# Patient Record
Sex: Female | Born: 1945 | Race: White | Hispanic: No | Marital: Married
Health system: Southern US, Community
[De-identification: ages and names within clinical notes are randomized; demographics above are authoritative.]

## PROBLEM LIST (undated history)

## (undated) DIAGNOSIS — I10 Essential (primary) hypertension: Secondary | ICD-10-CM

## (undated) DIAGNOSIS — I2510 Atherosclerotic heart disease of native coronary artery without angina pectoris: Secondary | ICD-10-CM

## (undated) DIAGNOSIS — E119 Type 2 diabetes mellitus without complications: Secondary | ICD-10-CM

## (undated) DIAGNOSIS — E7800 Pure hypercholesterolemia, unspecified: Secondary | ICD-10-CM

## (undated) HISTORY — PX: CARDIAC SURGERY: SHX584

## (undated) HISTORY — PX: ABDOMINAL HYSTERECTOMY: SHX81

## (undated) HISTORY — PX: CHOLECYSTECTOMY: SHX55

## (undated) HISTORY — PX: FRACTURE SURGERY: SHX138

## (undated) HISTORY — PX: OTHER SURGICAL HISTORY: SHX169

## (undated) HISTORY — PX: CORONARY ARTERY BYPASS GRAFT: SHX141

---

## 1998-02-26 ENCOUNTER — Emergency Department (HOSPITAL_COMMUNITY): Admission: EM | Admit: 1998-02-26 | Discharge: 1998-02-26 | Payer: Self-pay | Admitting: Emergency Medicine

## 1998-04-05 ENCOUNTER — Emergency Department (HOSPITAL_COMMUNITY): Admission: EM | Admit: 1998-04-05 | Discharge: 1998-04-05 | Payer: Self-pay

## 1998-04-06 ENCOUNTER — Emergency Department (HOSPITAL_COMMUNITY): Admission: EM | Admit: 1998-04-06 | Discharge: 1998-04-06 | Payer: Self-pay | Admitting: Emergency Medicine

## 1998-04-13 ENCOUNTER — Emergency Department (HOSPITAL_COMMUNITY): Admission: EM | Admit: 1998-04-13 | Discharge: 1998-04-13 | Payer: Self-pay

## 1998-04-17 ENCOUNTER — Emergency Department (HOSPITAL_COMMUNITY): Admission: EM | Admit: 1998-04-17 | Discharge: 1998-04-17 | Payer: Self-pay | Admitting: Emergency Medicine

## 1998-04-19 ENCOUNTER — Emergency Department (HOSPITAL_COMMUNITY): Admission: EM | Admit: 1998-04-19 | Discharge: 1998-04-19 | Payer: Self-pay | Admitting: Emergency Medicine

## 1998-04-22 ENCOUNTER — Other Ambulatory Visit: Admission: RE | Admit: 1998-04-22 | Discharge: 1998-04-22 | Payer: Self-pay | Admitting: Internal Medicine

## 1998-06-19 ENCOUNTER — Observation Stay (HOSPITAL_COMMUNITY): Admission: RE | Admit: 1998-06-19 | Discharge: 1998-06-20 | Payer: Self-pay | Admitting: Specialist

## 1998-06-19 ENCOUNTER — Encounter: Payer: Self-pay | Admitting: Specialist

## 1998-06-25 ENCOUNTER — Emergency Department (HOSPITAL_COMMUNITY): Admission: EM | Admit: 1998-06-25 | Discharge: 1998-06-25 | Payer: Self-pay

## 1998-06-27 ENCOUNTER — Encounter: Payer: Self-pay | Admitting: Emergency Medicine

## 1998-06-27 ENCOUNTER — Emergency Department (HOSPITAL_COMMUNITY): Admission: EM | Admit: 1998-06-27 | Discharge: 1998-06-27 | Payer: Self-pay | Admitting: Emergency Medicine

## 1998-08-23 ENCOUNTER — Emergency Department (HOSPITAL_COMMUNITY): Admission: EM | Admit: 1998-08-23 | Discharge: 1998-08-23 | Payer: Self-pay | Admitting: *Deleted

## 1998-09-22 ENCOUNTER — Encounter: Payer: Self-pay | Admitting: Gastroenterology

## 1998-09-22 ENCOUNTER — Ambulatory Visit (HOSPITAL_COMMUNITY): Admission: RE | Admit: 1998-09-22 | Discharge: 1998-09-22 | Payer: Self-pay | Admitting: Gastroenterology

## 1998-10-06 ENCOUNTER — Ambulatory Visit (HOSPITAL_COMMUNITY): Admission: RE | Admit: 1998-10-06 | Discharge: 1998-10-06 | Payer: Self-pay | Admitting: Specialist

## 1998-10-06 ENCOUNTER — Encounter: Payer: Self-pay | Admitting: Specialist

## 1998-11-09 ENCOUNTER — Emergency Department (HOSPITAL_COMMUNITY): Admission: EM | Admit: 1998-11-09 | Discharge: 1998-11-09 | Payer: Self-pay | Admitting: Emergency Medicine

## 1998-11-10 ENCOUNTER — Encounter: Payer: Self-pay | Admitting: Emergency Medicine

## 1998-11-15 ENCOUNTER — Encounter: Payer: Self-pay | Admitting: Emergency Medicine

## 1998-11-15 ENCOUNTER — Inpatient Hospital Stay (HOSPITAL_COMMUNITY): Admission: EM | Admit: 1998-11-15 | Discharge: 1998-11-17 | Payer: Self-pay | Admitting: *Deleted

## 1998-11-16 ENCOUNTER — Encounter: Payer: Self-pay | Admitting: Internal Medicine

## 1999-01-15 ENCOUNTER — Emergency Department (HOSPITAL_COMMUNITY): Admission: EM | Admit: 1999-01-15 | Discharge: 1999-01-15 | Payer: Self-pay | Admitting: Emergency Medicine

## 1999-04-14 ENCOUNTER — Encounter: Payer: Self-pay | Admitting: Emergency Medicine

## 1999-04-14 ENCOUNTER — Emergency Department (HOSPITAL_COMMUNITY): Admission: EM | Admit: 1999-04-14 | Discharge: 1999-04-14 | Payer: Self-pay | Admitting: Emergency Medicine

## 1999-06-04 ENCOUNTER — Encounter: Payer: Self-pay | Admitting: Gastroenterology

## 1999-06-04 ENCOUNTER — Ambulatory Visit (HOSPITAL_COMMUNITY): Admission: RE | Admit: 1999-06-04 | Discharge: 1999-06-04 | Payer: Self-pay | Admitting: Gastroenterology

## 1999-07-30 ENCOUNTER — Encounter: Payer: Self-pay | Admitting: Gastroenterology

## 1999-07-30 ENCOUNTER — Ambulatory Visit (HOSPITAL_COMMUNITY): Admission: RE | Admit: 1999-07-30 | Discharge: 1999-07-30 | Payer: Self-pay | Admitting: Gastroenterology

## 1999-08-14 ENCOUNTER — Encounter: Payer: Self-pay | Admitting: Emergency Medicine

## 1999-08-14 ENCOUNTER — Emergency Department (HOSPITAL_COMMUNITY): Admission: EM | Admit: 1999-08-14 | Discharge: 1999-08-14 | Payer: Self-pay | Admitting: Emergency Medicine

## 1999-12-31 ENCOUNTER — Emergency Department (HOSPITAL_COMMUNITY): Admission: EM | Admit: 1999-12-31 | Discharge: 1999-12-31 | Payer: Self-pay | Admitting: Emergency Medicine

## 2000-04-24 ENCOUNTER — Encounter: Payer: Self-pay | Admitting: Emergency Medicine

## 2000-04-24 ENCOUNTER — Emergency Department (HOSPITAL_COMMUNITY): Admission: EM | Admit: 2000-04-24 | Discharge: 2000-04-24 | Payer: Self-pay | Admitting: Emergency Medicine

## 2000-06-24 ENCOUNTER — Ambulatory Visit (HOSPITAL_COMMUNITY): Admission: RE | Admit: 2000-06-24 | Discharge: 2000-06-24 | Payer: Self-pay | Admitting: Family Medicine

## 2000-06-24 ENCOUNTER — Encounter: Payer: Self-pay | Admitting: Family Medicine

## 2000-07-07 ENCOUNTER — Ambulatory Visit (HOSPITAL_COMMUNITY): Admission: RE | Admit: 2000-07-07 | Discharge: 2000-07-07 | Payer: Self-pay | Admitting: Gastroenterology

## 2000-07-18 ENCOUNTER — Ambulatory Visit (HOSPITAL_COMMUNITY): Admission: RE | Admit: 2000-07-18 | Discharge: 2000-07-18 | Payer: Self-pay | Admitting: Gastroenterology

## 2000-09-04 ENCOUNTER — Ambulatory Visit (HOSPITAL_COMMUNITY): Admission: RE | Admit: 2000-09-04 | Discharge: 2000-09-04 | Payer: Self-pay | Admitting: Gastroenterology

## 2000-09-04 ENCOUNTER — Encounter: Payer: Self-pay | Admitting: Gastroenterology

## 2000-09-23 ENCOUNTER — Inpatient Hospital Stay (HOSPITAL_COMMUNITY): Admission: EM | Admit: 2000-09-23 | Discharge: 2000-09-30 | Payer: Self-pay | Admitting: *Deleted

## 2000-11-03 ENCOUNTER — Encounter (INDEPENDENT_AMBULATORY_CARE_PROVIDER_SITE_OTHER): Payer: Self-pay | Admitting: Specialist

## 2000-11-03 ENCOUNTER — Inpatient Hospital Stay (HOSPITAL_COMMUNITY): Admission: AD | Admit: 2000-11-03 | Discharge: 2000-11-13 | Payer: Self-pay | Admitting: Internal Medicine

## 2000-11-06 ENCOUNTER — Encounter: Payer: Self-pay | Admitting: Internal Medicine

## 2000-11-07 ENCOUNTER — Encounter: Payer: Self-pay | Admitting: Thoracic Surgery (Cardiothoracic Vascular Surgery)

## 2000-11-08 ENCOUNTER — Encounter: Payer: Self-pay | Admitting: Thoracic Surgery (Cardiothoracic Vascular Surgery)

## 2000-11-09 ENCOUNTER — Encounter: Payer: Self-pay | Admitting: Thoracic Surgery (Cardiothoracic Vascular Surgery)

## 2000-12-06 ENCOUNTER — Encounter
Admission: RE | Admit: 2000-12-06 | Discharge: 2000-12-06 | Payer: Self-pay | Admitting: Thoracic Surgery (Cardiothoracic Vascular Surgery)

## 2000-12-06 ENCOUNTER — Encounter: Payer: Self-pay | Admitting: Thoracic Surgery (Cardiothoracic Vascular Surgery)

## 2001-03-04 ENCOUNTER — Encounter: Payer: Self-pay | Admitting: Emergency Medicine

## 2001-03-04 ENCOUNTER — Emergency Department (HOSPITAL_COMMUNITY): Admission: EM | Admit: 2001-03-04 | Discharge: 2001-03-04 | Payer: Self-pay | Admitting: Emergency Medicine

## 2002-11-03 ENCOUNTER — Encounter: Payer: Self-pay | Admitting: Emergency Medicine

## 2002-11-03 ENCOUNTER — Inpatient Hospital Stay (HOSPITAL_COMMUNITY): Admission: EM | Admit: 2002-11-03 | Discharge: 2002-11-06 | Payer: Self-pay | Admitting: Emergency Medicine

## 2002-11-04 ENCOUNTER — Encounter: Payer: Self-pay | Admitting: Cardiovascular Disease

## 2003-01-24 ENCOUNTER — Emergency Department (HOSPITAL_COMMUNITY): Admission: EM | Admit: 2003-01-24 | Discharge: 2003-01-24 | Payer: Self-pay | Admitting: Emergency Medicine

## 2003-02-20 ENCOUNTER — Emergency Department (HOSPITAL_COMMUNITY): Admission: EM | Admit: 2003-02-20 | Discharge: 2003-02-20 | Payer: Self-pay | Admitting: Emergency Medicine

## 2003-03-19 ENCOUNTER — Emergency Department (HOSPITAL_COMMUNITY): Admission: EM | Admit: 2003-03-19 | Discharge: 2003-03-19 | Payer: Self-pay | Admitting: Emergency Medicine

## 2003-03-29 ENCOUNTER — Emergency Department (HOSPITAL_COMMUNITY): Admission: EM | Admit: 2003-03-29 | Discharge: 2003-03-29 | Payer: Self-pay | Admitting: Emergency Medicine

## 2003-03-29 ENCOUNTER — Encounter: Payer: Self-pay | Admitting: Emergency Medicine

## 2003-04-02 ENCOUNTER — Ambulatory Visit (HOSPITAL_COMMUNITY): Admission: RE | Admit: 2003-04-02 | Discharge: 2003-04-02 | Payer: Self-pay | Admitting: Emergency Medicine

## 2003-04-02 ENCOUNTER — Encounter: Payer: Self-pay | Admitting: Emergency Medicine

## 2003-06-01 ENCOUNTER — Encounter: Payer: Self-pay | Admitting: Emergency Medicine

## 2003-06-01 ENCOUNTER — Inpatient Hospital Stay (HOSPITAL_COMMUNITY): Admission: AD | Admit: 2003-06-01 | Discharge: 2003-06-05 | Payer: Self-pay | Admitting: Cardiovascular Disease

## 2003-07-17 ENCOUNTER — Encounter: Payer: Self-pay | Admitting: Emergency Medicine

## 2003-07-17 ENCOUNTER — Emergency Department (HOSPITAL_COMMUNITY): Admission: EM | Admit: 2003-07-17 | Discharge: 2003-07-17 | Payer: Self-pay | Admitting: Emergency Medicine

## 2003-09-28 ENCOUNTER — Emergency Department (HOSPITAL_COMMUNITY): Admission: EM | Admit: 2003-09-28 | Discharge: 2003-09-29 | Payer: Self-pay | Admitting: Emergency Medicine

## 2003-12-07 ENCOUNTER — Observation Stay (HOSPITAL_COMMUNITY): Admission: EM | Admit: 2003-12-07 | Discharge: 2003-12-08 | Payer: Self-pay | Admitting: Emergency Medicine

## 2004-02-28 ENCOUNTER — Emergency Department (HOSPITAL_COMMUNITY): Admission: EM | Admit: 2004-02-28 | Discharge: 2004-02-28 | Payer: Self-pay | Admitting: Emergency Medicine

## 2004-04-05 ENCOUNTER — Emergency Department (HOSPITAL_COMMUNITY): Admission: EM | Admit: 2004-04-05 | Discharge: 2004-04-05 | Payer: Self-pay | Admitting: *Deleted

## 2004-04-17 ENCOUNTER — Emergency Department (HOSPITAL_COMMUNITY): Admission: EM | Admit: 2004-04-17 | Discharge: 2004-04-17 | Payer: Self-pay | Admitting: Emergency Medicine

## 2004-06-04 ENCOUNTER — Inpatient Hospital Stay (HOSPITAL_COMMUNITY): Admission: EM | Admit: 2004-06-04 | Discharge: 2004-06-08 | Payer: Self-pay | Admitting: Emergency Medicine

## 2004-08-03 ENCOUNTER — Emergency Department (HOSPITAL_COMMUNITY): Admission: EM | Admit: 2004-08-03 | Discharge: 2004-08-03 | Payer: Self-pay | Admitting: Emergency Medicine

## 2004-10-31 ENCOUNTER — Emergency Department (HOSPITAL_COMMUNITY): Admission: EM | Admit: 2004-10-31 | Discharge: 2004-11-01 | Payer: Self-pay | Admitting: Emergency Medicine

## 2004-12-07 ENCOUNTER — Emergency Department (HOSPITAL_COMMUNITY): Admission: EM | Admit: 2004-12-07 | Discharge: 2004-12-07 | Payer: Self-pay | Admitting: Family Medicine

## 2004-12-07 ENCOUNTER — Emergency Department (HOSPITAL_COMMUNITY): Admission: EM | Admit: 2004-12-07 | Discharge: 2004-12-07 | Payer: Self-pay | Admitting: Emergency Medicine

## 2004-12-24 ENCOUNTER — Emergency Department (HOSPITAL_COMMUNITY): Admission: EM | Admit: 2004-12-24 | Discharge: 2004-12-24 | Payer: Self-pay | Admitting: Family Medicine

## 2005-02-04 ENCOUNTER — Emergency Department (HOSPITAL_COMMUNITY): Admission: EM | Admit: 2005-02-04 | Discharge: 2005-02-04 | Payer: Self-pay | Admitting: Family Medicine

## 2005-03-07 ENCOUNTER — Emergency Department (HOSPITAL_COMMUNITY): Admission: EM | Admit: 2005-03-07 | Discharge: 2005-03-07 | Payer: Self-pay | Admitting: Emergency Medicine

## 2005-03-24 ENCOUNTER — Inpatient Hospital Stay (HOSPITAL_COMMUNITY): Admission: EM | Admit: 2005-03-24 | Discharge: 2005-03-28 | Payer: Self-pay | Admitting: Emergency Medicine

## 2005-03-28 ENCOUNTER — Encounter (INDEPENDENT_AMBULATORY_CARE_PROVIDER_SITE_OTHER): Payer: Self-pay | Admitting: Cardiology

## 2005-08-14 ENCOUNTER — Inpatient Hospital Stay (HOSPITAL_COMMUNITY): Admission: EM | Admit: 2005-08-14 | Discharge: 2005-08-16 | Payer: Self-pay | Admitting: Emergency Medicine

## 2005-11-10 ENCOUNTER — Emergency Department (HOSPITAL_COMMUNITY): Admission: EM | Admit: 2005-11-10 | Discharge: 2005-11-10 | Payer: Self-pay | Admitting: Pediatrics

## 2005-11-13 ENCOUNTER — Emergency Department (HOSPITAL_COMMUNITY): Admission: EM | Admit: 2005-11-13 | Discharge: 2005-11-13 | Payer: Self-pay | Admitting: Emergency Medicine

## 2006-03-26 ENCOUNTER — Observation Stay (HOSPITAL_COMMUNITY): Admission: EM | Admit: 2006-03-26 | Discharge: 2006-03-28 | Payer: Self-pay | Admitting: Emergency Medicine

## 2006-05-07 ENCOUNTER — Emergency Department (HOSPITAL_COMMUNITY): Admission: EM | Admit: 2006-05-07 | Discharge: 2006-05-07 | Payer: Self-pay | Admitting: Emergency Medicine

## 2006-07-06 IMAGING — CR DG CHEST 1V PORT
1 series · 1 of 1 positions shown · non-contrast
Comparison: 08/13/2005.

CLINICAL DATA: Chest pain and shortness of breath. Diabetes.

PORTABLE CHEST - 1 VIEW

[view not recorded]
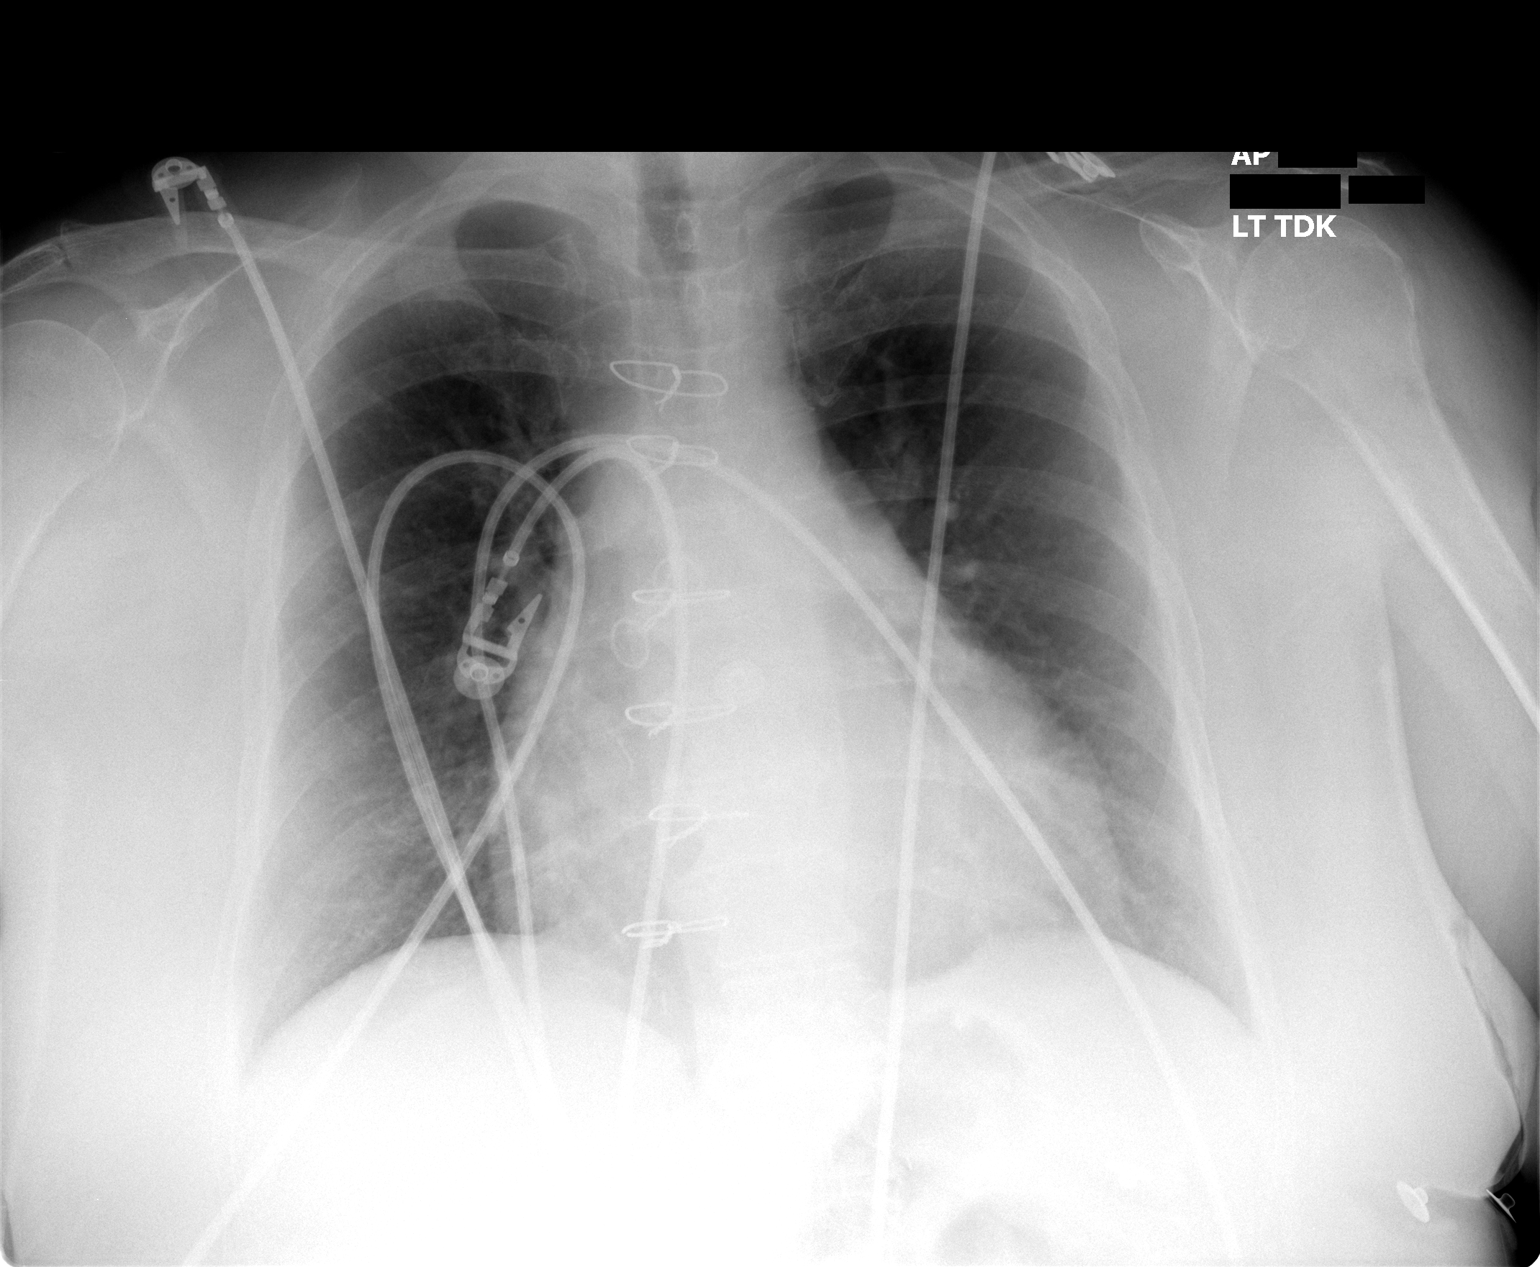

[1 of 1 positions shown; findings below may reference images not displayed]

FINDINGS: Stable and enlarged cardiac silhouette and post CABG changes. Clear
lungs with normal vascularity. Lower thoracic spine degenerative changes.  

IMPRESSION

Stable cardiomegaly. No acute abnormality.

## 2006-08-17 IMAGING — CR DG ABDOMEN ACUTE W/ 1V CHEST
3 series · 3 of 3 positions shown · non-contrast
Comparison: 04/05/2004

CLINICAL DATA: Vomiting

ABDOMEN SERIES - 2 VIEW & CHEST - 1 VIEW

[w chest pa]
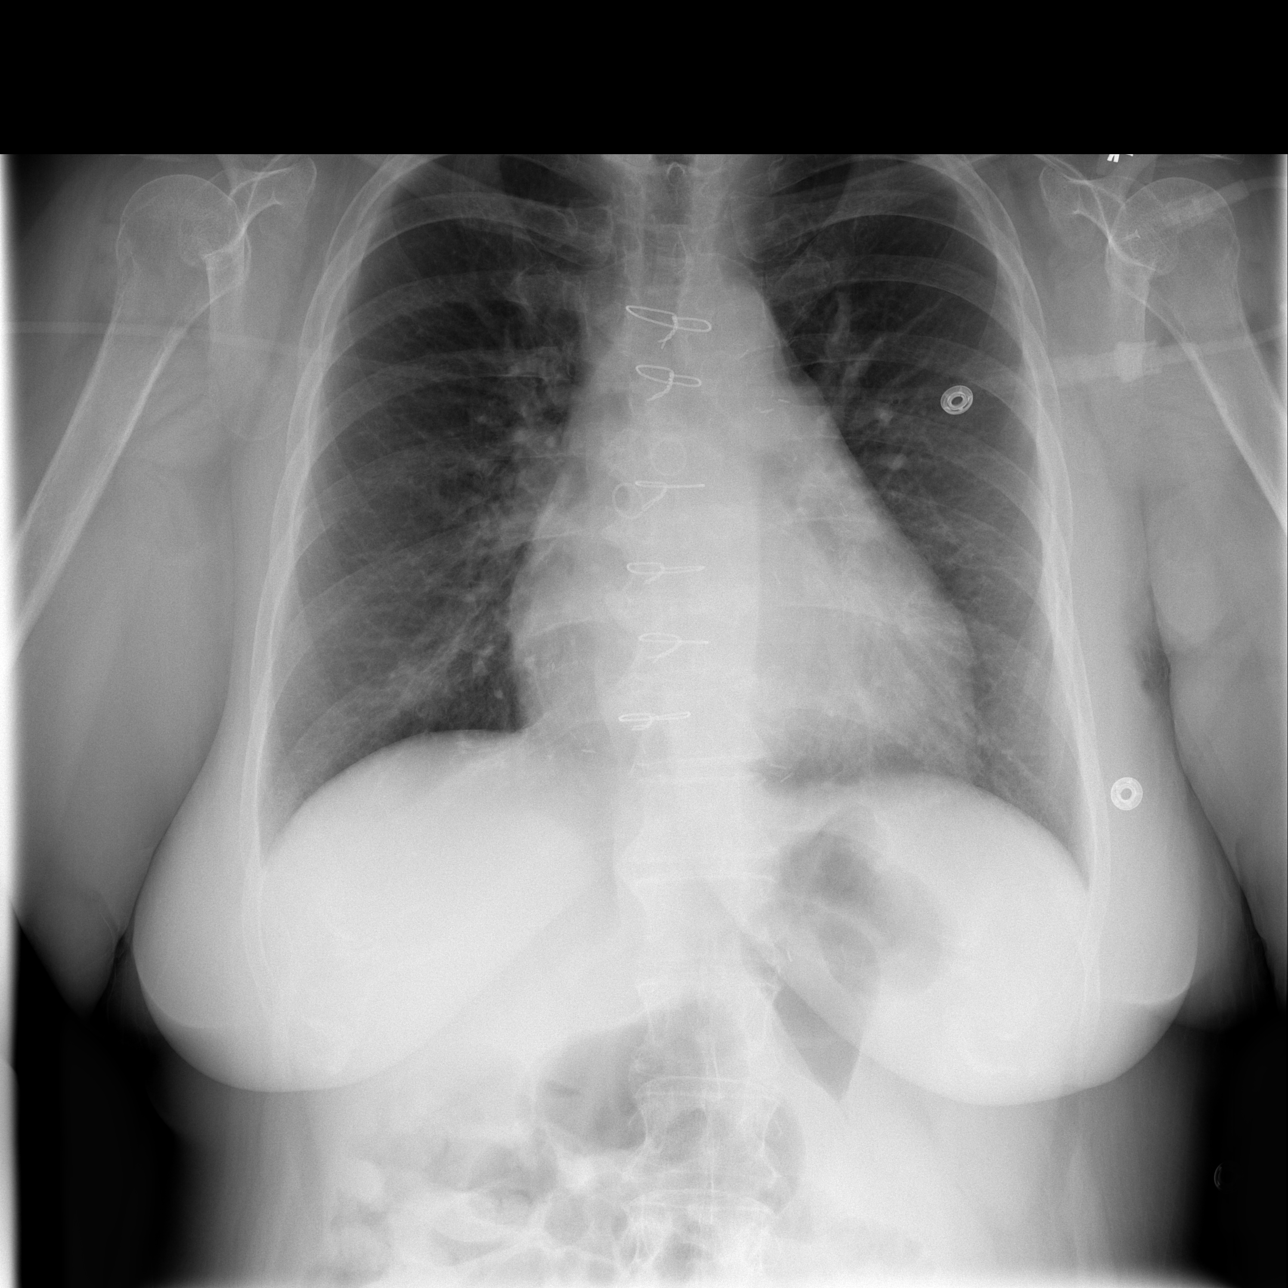

[w abdomen upright]
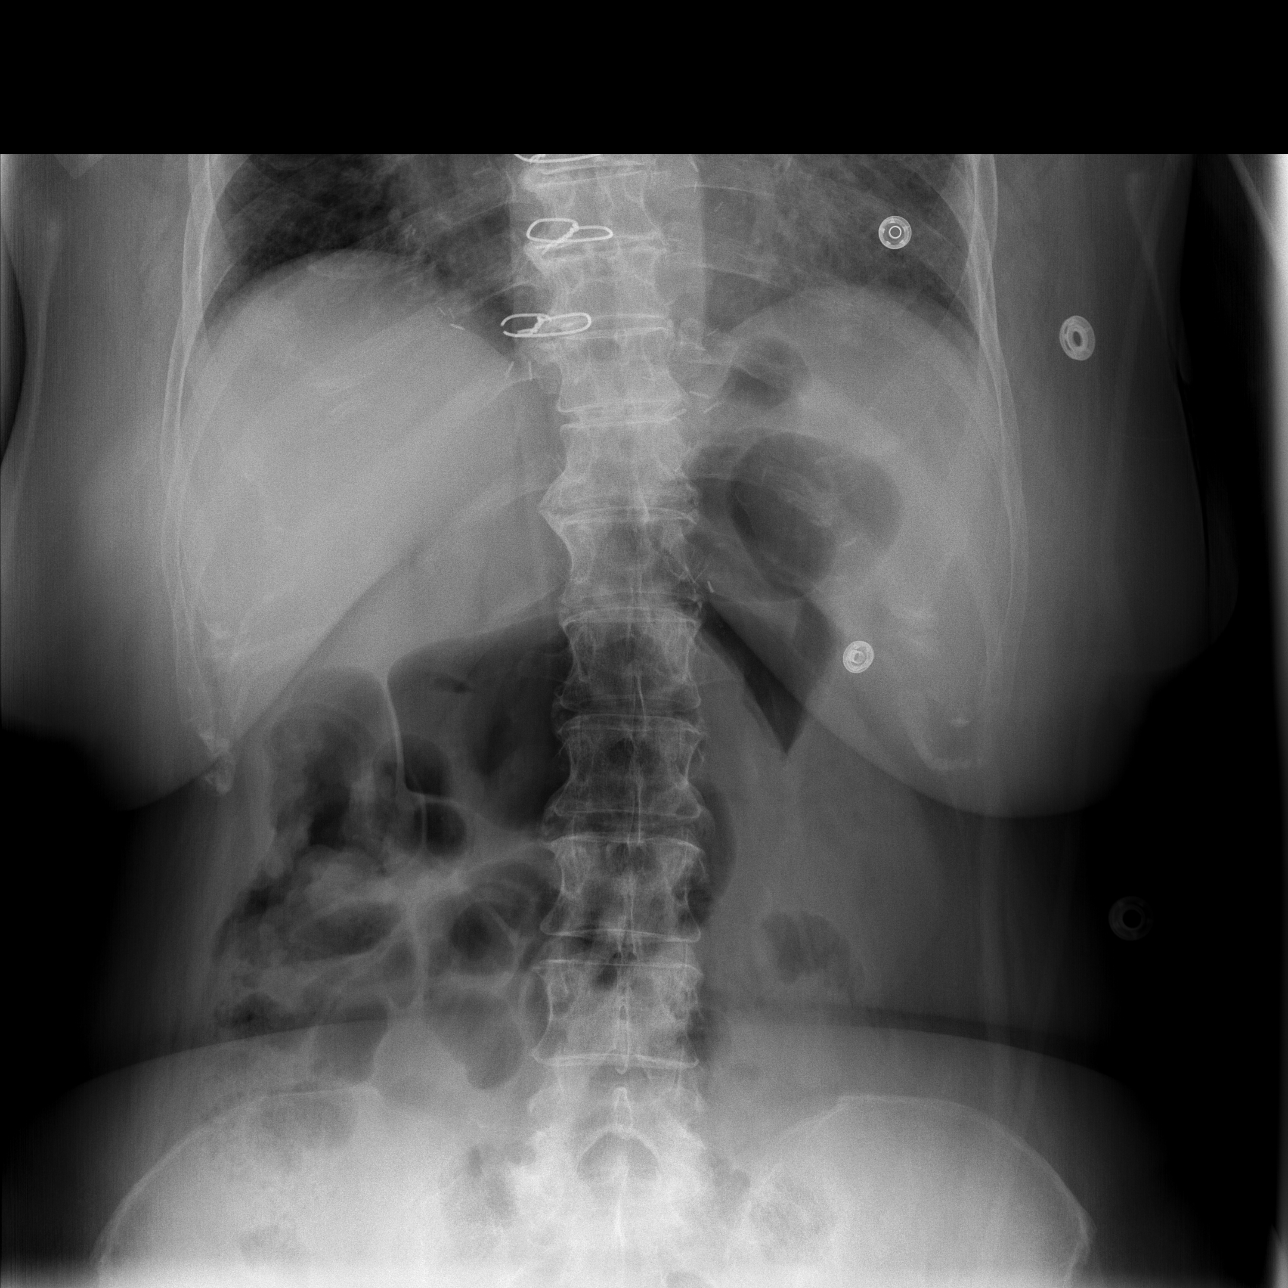

[t abdomen supine]
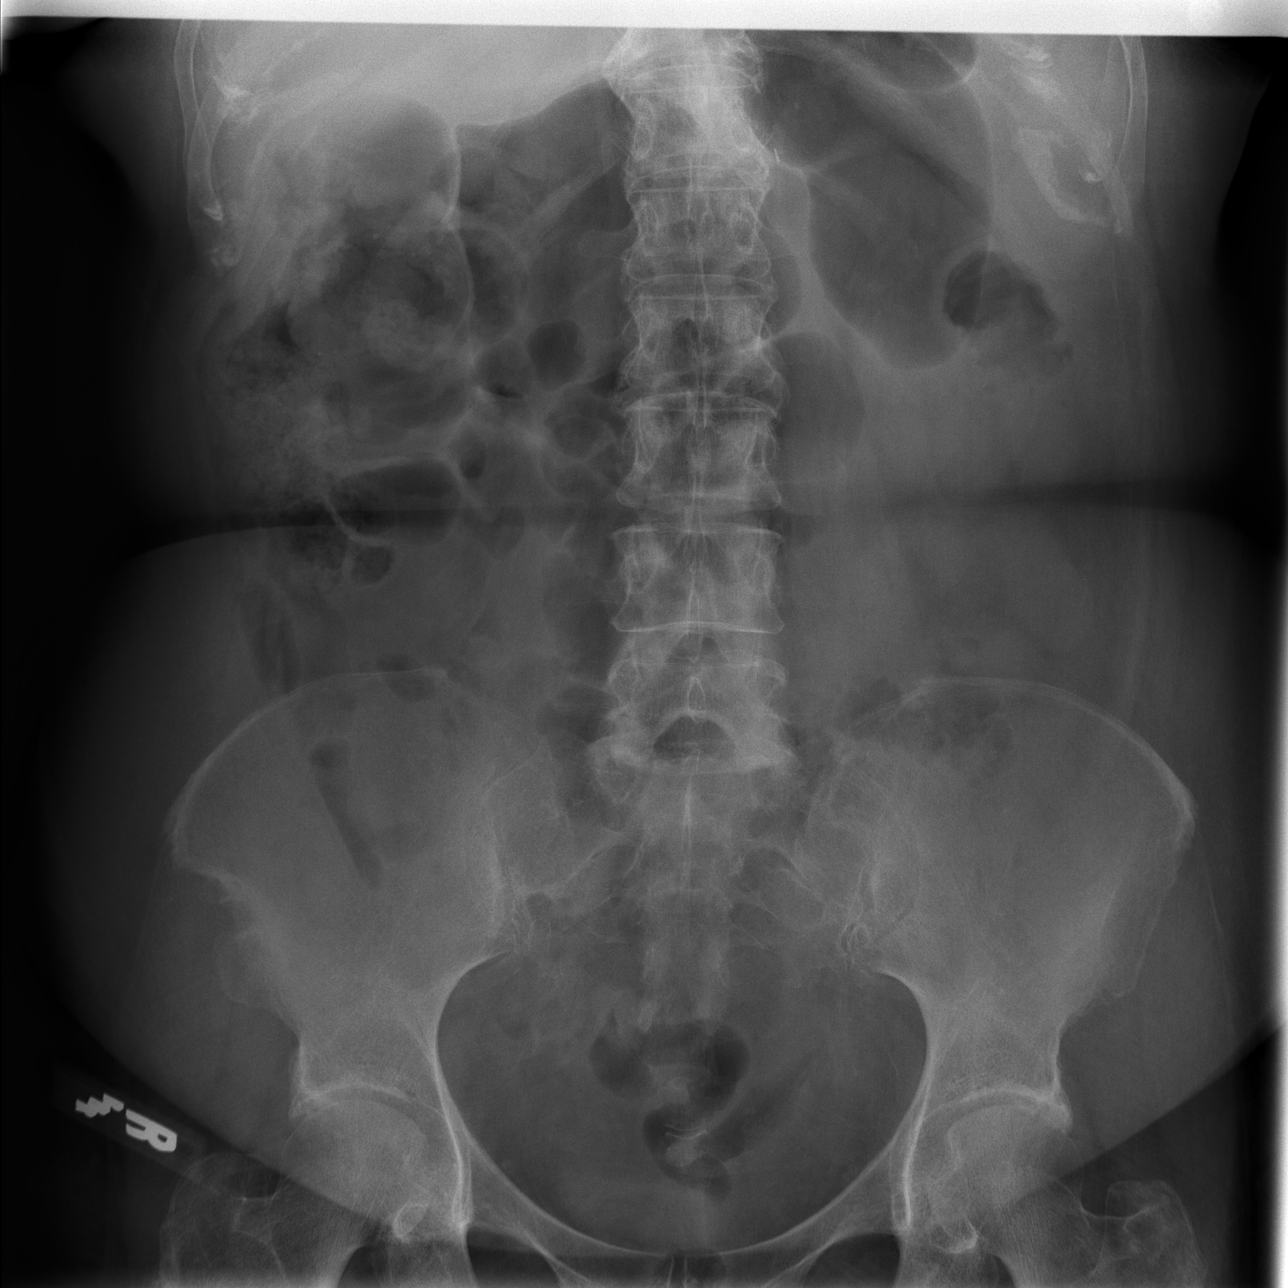

[3 of 3 positions shown; findings below may reference images not displayed]

FINDINGS: Patient status post CABG. There is mild cardiomegaly and interstitial
prominence. No focal opacities or effusions.

There is a nonobstructive bowel gas pattern. No free air. No evidence of
organomegaly or suspicious calcification. Degenerative changes in the hips.

IMPRESSION

No obstruction or free air. 

Cardiomegaly. Mild interstitial prominence.

## 2006-11-20 ENCOUNTER — Emergency Department (HOSPITAL_COMMUNITY): Admission: EM | Admit: 2006-11-20 | Discharge: 2006-11-20 | Payer: Self-pay | Admitting: Emergency Medicine

## 2006-11-23 ENCOUNTER — Emergency Department (HOSPITAL_COMMUNITY): Admission: EM | Admit: 2006-11-23 | Discharge: 2006-11-23 | Payer: Self-pay | Admitting: Emergency Medicine

## 2006-12-02 ENCOUNTER — Emergency Department (HOSPITAL_COMMUNITY): Admission: EM | Admit: 2006-12-02 | Discharge: 2006-12-02 | Payer: Self-pay | Admitting: Emergency Medicine

## 2007-05-26 ENCOUNTER — Emergency Department (HOSPITAL_COMMUNITY): Admission: EM | Admit: 2007-05-26 | Discharge: 2007-05-26 | Payer: Self-pay | Admitting: Emergency Medicine

## 2007-06-06 ENCOUNTER — Emergency Department (HOSPITAL_COMMUNITY): Admission: EM | Admit: 2007-06-06 | Discharge: 2007-06-06 | Payer: Self-pay | Admitting: Emergency Medicine

## 2007-09-09 ENCOUNTER — Emergency Department (HOSPITAL_COMMUNITY): Admission: EM | Admit: 2007-09-09 | Discharge: 2007-09-09 | Payer: Self-pay | Admitting: Emergency Medicine

## 2007-09-23 ENCOUNTER — Emergency Department (HOSPITAL_COMMUNITY): Admission: EM | Admit: 2007-09-23 | Discharge: 2007-09-23 | Payer: Self-pay | Admitting: Emergency Medicine

## 2009-07-02 ENCOUNTER — Ambulatory Visit: Payer: Self-pay | Admitting: Internal Medicine

## 2009-07-02 ENCOUNTER — Inpatient Hospital Stay (HOSPITAL_COMMUNITY): Admission: EM | Admit: 2009-07-02 | Discharge: 2009-07-07 | Payer: Self-pay | Admitting: Orthopedic Surgery

## 2009-09-01 ENCOUNTER — Emergency Department (HOSPITAL_COMMUNITY): Admission: EM | Admit: 2009-09-01 | Discharge: 2009-09-01 | Payer: Self-pay | Admitting: Emergency Medicine

## 2010-12-28 LAB — URINE MICROSCOPIC-ADD ON

## 2010-12-28 LAB — DIFFERENTIAL
Basophils Relative: 0 % (ref 0–1)
Lymphocytes Relative: 17 % (ref 12–46)
Lymphs Abs: 0.7 10*3/uL (ref 0.7–4.0)
Monocytes Relative: 11 % (ref 3–12)
Neutro Abs: 2.7 10*3/uL (ref 1.7–7.7)
Neutrophils Relative %: 71 % (ref 43–77)

## 2010-12-28 LAB — URINALYSIS, ROUTINE W REFLEX MICROSCOPIC
Hgb urine dipstick: NEGATIVE
Ketones, ur: 40 mg/dL — AB
Specific Gravity, Urine: 1.024 (ref 1.005–1.030)
pH: 6.5 (ref 5.0–8.0)

## 2010-12-28 LAB — CBC
HCT: 33.9 % — ABNORMAL LOW (ref 36.0–46.0)
Hemoglobin: 11.6 g/dL — ABNORMAL LOW (ref 12.0–15.0)
MCHC: 34.3 g/dL (ref 30.0–36.0)
MCV: 90.1 fL (ref 78.0–100.0)
Platelets: 171 10*3/uL (ref 150–400)
RDW: 17.1 % — ABNORMAL HIGH (ref 11.5–15.5)

## 2010-12-28 LAB — COMPREHENSIVE METABOLIC PANEL
Alkaline Phosphatase: 72 U/L (ref 39–117)
BUN: 9 mg/dL (ref 6–23)
Calcium: 9 mg/dL (ref 8.4–10.5)
Creatinine, Ser: 0.68 mg/dL (ref 0.4–1.2)
Glucose, Bld: 194 mg/dL — ABNORMAL HIGH (ref 70–99)
Total Protein: 6.5 g/dL (ref 6.0–8.3)

## 2010-12-28 LAB — URINE CULTURE

## 2010-12-30 LAB — ABO/RH: ABO/RH(D): A POS

## 2010-12-30 LAB — GLUCOSE, CAPILLARY
Glucose-Capillary: 106 mg/dL — ABNORMAL HIGH (ref 70–99)
Glucose-Capillary: 120 mg/dL — ABNORMAL HIGH (ref 70–99)
Glucose-Capillary: 131 mg/dL — ABNORMAL HIGH (ref 70–99)
Glucose-Capillary: 141 mg/dL — ABNORMAL HIGH (ref 70–99)
Glucose-Capillary: 143 mg/dL — ABNORMAL HIGH (ref 70–99)
Glucose-Capillary: 155 mg/dL — ABNORMAL HIGH (ref 70–99)
Glucose-Capillary: 196 mg/dL — ABNORMAL HIGH (ref 70–99)
Glucose-Capillary: 207 mg/dL — ABNORMAL HIGH (ref 70–99)
Glucose-Capillary: 211 mg/dL — ABNORMAL HIGH (ref 70–99)
Glucose-Capillary: 81 mg/dL (ref 70–99)
Glucose-Capillary: 93 mg/dL (ref 70–99)

## 2010-12-30 LAB — BASIC METABOLIC PANEL
BUN: 28 mg/dL — ABNORMAL HIGH (ref 6–23)
BUN: 9 mg/dL (ref 6–23)
CO2: 22 mEq/L (ref 19–32)
CO2: 26 mEq/L (ref 19–32)
Calcium: 7.9 mg/dL — ABNORMAL LOW (ref 8.4–10.5)
Calcium: 8.2 mg/dL — ABNORMAL LOW (ref 8.4–10.5)
Calcium: 8.3 mg/dL — ABNORMAL LOW (ref 8.4–10.5)
Calcium: 8.6 mg/dL (ref 8.4–10.5)
Calcium: 9.1 mg/dL (ref 8.4–10.5)
Chloride: 104 mEq/L (ref 96–112)
Chloride: 106 mEq/L (ref 96–112)
Creatinine, Ser: 0.7 mg/dL (ref 0.4–1.2)
Creatinine, Ser: 0.76 mg/dL (ref 0.4–1.2)
Creatinine, Ser: 1.42 mg/dL — ABNORMAL HIGH (ref 0.4–1.2)
Creatinine, Ser: 1.62 mg/dL — ABNORMAL HIGH (ref 0.4–1.2)
GFR calc Af Amer: 39 mL/min — ABNORMAL LOW (ref >60)
GFR calc Af Amer: 46 mL/min — ABNORMAL LOW (ref >60)
GFR calc Af Amer: >60 mL/min (ref >60)
GFR calc Af Amer: >60 mL/min (ref >60)
GFR calc Af Amer: >60 mL/min (ref >60)
GFR calc non Af Amer: 32 mL/min — ABNORMAL LOW (ref >60)
GFR calc non Af Amer: 38 mL/min — ABNORMAL LOW (ref >60)
GFR calc non Af Amer: >60 mL/min (ref >60)
Glucose, Bld: 133 mg/dL — ABNORMAL HIGH (ref 70–99)
Glucose, Bld: 178 mg/dL — ABNORMAL HIGH (ref 70–99)
Glucose, Bld: 74 mg/dL (ref 70–99)
Potassium: 3.6 mEq/L (ref 3.5–5.1)
Sodium: 137 mEq/L (ref 135–145)
Sodium: 138 mEq/L (ref 135–145)

## 2010-12-30 LAB — URINE CULTURE

## 2010-12-30 LAB — URINALYSIS, MICROSCOPIC ONLY
Bilirubin Urine: NEGATIVE
Glucose, UA: NEGATIVE mg/dL
Hgb urine dipstick: NEGATIVE
Ketones, ur: 15 mg/dL — AB
Specific Gravity, Urine: 1.022 (ref 1.005–1.030)
pH: 5 (ref 5.0–8.0)

## 2010-12-30 LAB — CBC
HCT: 26.6 % — ABNORMAL LOW (ref 36.0–46.0)
HCT: 30.5 % — ABNORMAL LOW (ref 36.0–46.0)
Hemoglobin: 11.7 g/dL — ABNORMAL LOW (ref 12.0–15.0)
Hemoglobin: 9 g/dL — ABNORMAL LOW (ref 12.0–15.0)
MCHC: 33.3 g/dL (ref 30.0–36.0)
MCHC: 33.9 g/dL (ref 30.0–36.0)
MCV: 86.8 fL (ref 78.0–100.0)
MCV: 88.5 fL (ref 78.0–100.0)
MCV: 88.8 fL (ref 78.0–100.0)
MCV: 89.3 fL (ref 78.0–100.0)
Platelets: 119 10*3/uL — ABNORMAL LOW (ref 150–400)
Platelets: 133 10*3/uL — ABNORMAL LOW (ref 150–400)
Platelets: 159 10*3/uL (ref 150–400)
RBC: 2.8 MIL/uL — ABNORMAL LOW (ref 3.87–5.11)
RBC: 3.28 MIL/uL — ABNORMAL LOW (ref 3.87–5.11)
RDW: 15 % (ref 11.5–15.5)
RDW: 15.3 % (ref 11.5–15.5)
RDW: 15.4 % (ref 11.5–15.5)
RDW: 15.4 % (ref 11.5–15.5)
WBC: 4.5 10*3/uL (ref 4.0–10.5)
WBC: 5 10*3/uL (ref 4.0–10.5)
WBC: 8.1 10*3/uL (ref 4.0–10.5)

## 2010-12-30 LAB — TYPE AND SCREEN
ABO/RH(D): A POS
ABO/RH(D): A POS
Antibody Screen: NEGATIVE

## 2010-12-30 LAB — DIFFERENTIAL
Basophils Absolute: 0.1 10*3/uL (ref 0.0–0.1)
Lymphocytes Relative: 17 % (ref 12–46)
Monocytes Absolute: 0.2 10*3/uL (ref 0.1–1.0)
Neutro Abs: 3.7 10*3/uL (ref 1.7–7.7)
Neutrophils Relative %: 76 % (ref 43–77)

## 2010-12-30 LAB — PROTIME-INR
INR: 1.36 (ref 0.00–1.49)
INR: 1.57 — ABNORMAL HIGH (ref 0.00–1.49)
Prothrombin Time: 16.7 seconds — ABNORMAL HIGH (ref 11.6–15.2)
Prothrombin Time: 18.6 seconds — ABNORMAL HIGH (ref 11.6–15.2)
Prothrombin Time: 20.4 seconds — ABNORMAL HIGH (ref 11.6–15.2)

## 2010-12-30 LAB — APTT
aPTT: 28 seconds (ref 24–37)
aPTT: 34 seconds (ref 24–37)

## 2010-12-30 LAB — CK TOTAL AND CKMB (NOT AT ARMC)
CK, MB: 1.1 ng/mL (ref 0.3–4.0)
Total CK: 75 U/L (ref 7–177)

## 2010-12-30 LAB — IRON AND TIBC
Saturation Ratios: 6 % — ABNORMAL LOW (ref 20–55)
TIBC: 252 ug/dL (ref 250–470)

## 2010-12-30 LAB — HEMOGLOBIN A1C: Mean Plasma Glucose: 174 mg/dL

## 2011-02-11 NOTE — Procedures (Signed)
[place]. [facility]  Patient:    [name], [name]                          MRN: [id number] Proc. Date: 07/18/00 Adm. Date:  [id number] Disc. Date: [id number] Attending:  Louie Bun                           Procedure Report  PROCEDURE:  Esophageal motility study.  ENDOSCOPIST:  Everardo All. Madilyn Fireman, M.D.  INDICATIONS:  Persistent dysphagia with suspected esophageal motility disorder, with failure of proton pump inhibitor and empiric esophageal dilatation with an EGD showing no obvious stricture, ring, or other anatomic constricting lesions of the esophagus.  RESULTS: 1. Upper esophageal sphincter:  Not evaluated. 2. Esophageal body:  Lower esophageal body:  There were 70% peristaltic    waves, 10% nontransmitted, and 20% retrograde. Upper esophageal body:    There was 0% peristalsis, 20% simultaneous contractions, and 80% retrograde    contractions.  There were generally low to normal amplitude waves,    resulting from wet swallows. 3. Lower esophageal sphincter:  Resting pressure was low at 5.9 mmHg, with    low borderline relaxation at 68%.  IMPRESSION: 1. Significant nonspecific esophageal motility disorder. 2. Low lower esophageal sphincter pressure with borderline relaxation.  PLAN:  Will continue proton pump inhibitor and consider a calcium-channel blocker or long-acting nitrate. DD:  07/23/00 TD:  07/23/00 Job: [id number]

## 2011-02-11 NOTE — Discharge Summary (Signed)
[place]  Patient:    [name], [name]                          MRN: [id number] Adm. Date:  [id number] Disc. Date: [id number] Attending:  Daisey Must Dictator:   Abelino Derrick, P.A.-C. [facility] CC:         Belva Crome, M.D. - [address], [place], [place]  [place]ie [place]. White, M.D. - [facility]   Discharge Summary  DISCHARGE DIAGNOSES: 1. Status post anterior myocardial infarction, treated with left    anterior descending coronary artery intervention on September 23, 2000. 2. Apical akinesis and left ventricular dysfunction, plan for Coumadin    for six months. 3. Hyperlipidemia. 4. Non-insulin-dependent diabetes mellitus. 5. Hypertension. 6. Gastroesophageal reflux disease.  HISTORY OF PRESENT ILLNESS:  The patient is a 65 year old female with diabetes mellitus, hypertension, and hyperlipidemia, and a family history of coronary artery disease, who presented to the [facility] Emergency Room with chest pain.  An electrocardiogram showed anterior ST elevation in V1 through V4.  She was treated with aspirin, heparin, and nitroglycerin, and transferred to [facility].  Her pain was better on arrival but still present.  HOSPITAL COURSE:  She was taken to the cardiac catheterization laboratory by Dr. Loraine Leriche Pulsipher.  A cardiac catheterization revealed a 50% RCA, 80% proximal OM-I, a 70% distal circumflex, a 99% proximal LAD, a 95% distal LAD. She underwent a PTCA to the LAD.  Postoperatively she received Integrilin for 48 hours.  She was transferred to the telemetry floor and ambulated.  Her ejection fraction was depressed, with an ejection fraction of 24%, with some apical akinesis.  It was decided to add Coumadin for six months.  DISPOSITION:  The patient is for discharged on September 30, 2000.  FOLLOWUP:  She will contact Dr. Danton Clap office for further followup. She will have a BMP and a pro time checked  on Monday at Dr. Leisa Lenz office.  DISCHARGE MEDICATIONS:  1. Lasix 20 mg q.d.  2. Potassium 20 mEq q.d.  3. Coreg 6.25 mg b.i.d.  4. Glucophage 500 mg, three tablets with dinner.  5. Aspirin 81 mg q.d.  6. Coumadin 3 mg q.d.  7. Lipitor 20 mg at night.  8. Prevacid 30 mg at night.  9. Celebrex 200 mg q.d. 10. Flexeril 10 mg q.d. p.r.n. 11. Altace 2.5 mg b.i.d.  LABORATORY DATA:  Electrocardiogram shows sinus rhythm, poor anterior R-wave progression, anterior T-wave inversion, and lateral T-wave inversion.  Chest x-ray showed mild bibasilar interstitial edema on September 28, 2000.  White count 4.4, hemoglobin 9.9, hematocrit 28.5, platelets 141.  INR 1.2 on admission, 2.4 at discharge.  Sodium 133, potassium 3.7, BUN 11, creatinine 0.5.  CPK peaked at 1683, with 106 MB.  Lipid profile showed a cholesterol of 130, HDL 36, LDL 59.  CONDITION ON DISCHARGE:  The patient is discharged in stable condition. DD:  10/20/00 TD:  10/20/00 Job: [id number]

## 2011-02-11 NOTE — Consult Note (Signed)
[place]. [facility]  Patient:    [name], [name]                        MRN: [id number] Proc. Date: 03/04/01 Adm. Date:  [id number] Attending:  Doug Sou CC:         Dr. Tomie China, [facility], [place]   Consultation Report  REASON FOR CONSULTATION:  I was asked by Doug Sou, M.D., to evaluate [name] for left arm pain.  HISTORY OF PRESENT ILLNESS:  [name] is a 65 year old white female who has known coronary artery disease.  She is status post recent coronary bypass grafting after an anterior myocardial infarction in the past.  I do not have the most recent record of her grafting procedure; however, has had several coronary interventions in the past with Daisey Must, M.D.  The surgery was done in February.  She has an appointment with Dr. Tomie China this Tuesday.  Over the last week and a half, she has been having chronic left arm pain.  She has also had some intermittent nausea as well as some diarrhea.  She also admits to some chills.  She denies any vomiting, melena, or hematochezia, genitourinary complaints.  While at [employer] and singing, she developed some sharp, stabbing pain in her chest, followed by some tightness.  Her arm had been hurting most of the day.  She was told by her friends to come to the emergency room.  She did not have any sublingual nitroglycerin.  Upon arrival, she had an EKG, which shows normal sinus rhythm with poor R-wave progression across the anterior precordium.  She has some mild ST segment flattening in the lateral leads with biphasic Ts.  These changes are comparable to a previous ECG I have a copy of in January.  In fact, the T-wave changes looked less obvious, and there is no ST segment elevation at present.  Her CPK enzyme is normal at 58.  CBC shows a hemoglobin of 12.1, white count of 4.5.  Chest x-ray shows mild cardiomegaly but no edema or effusions  or infiltrate.  A BMET shows a blood sugar of 256, potassium of 3.9, normal pH of 7.41, and a glucose of 256.  PHYSICAL EXAMINATION:  GENERAL:  She is currently in no acute distress.  SKIN:  Her exam demonstrates her skin to be warm and dry.  NECK:  Her carotid upstrokes were equal bilaterally with a left carotid scar present.  There is no JVD.  CHEST:  Lungs were clear to auscultation.  The sternotomy site is healing nicely.  CARDIAC:  Her heart exam reveals a regular rate and rhythm without murmur, rub, or gallop.  ABDOMEN:  Soft with good bowel sounds.  There is no pinpoint or any diffuse tenderness.  There is no hepatosplenomegaly.  EXTREMITIES:  No cyanosis, clubbing, or edema.  Pulses are actually quite good distally.  DISCUSSION AND RECOMMENDATIONS:  I had a long talk with [name] and her family.  I think much of her symptoms are due to musculoskeletal or to cervical disk disease.  She has had some previous surgery in that area. Including having arm pain all day and really off and on over the last week and a half, we would expect to see some changes in her EKG or her enzymes.  Some of this could be gastroparesis, since she had eaten dinner before she went to church.  She could also have some  esophageal dysfunction as well.  I have advised her to keep her appointment with Dr. Tomie China on Tuesday.  She has an appointment with Dr. Mathis Bud, her primary care physician, on Wednesday. I have asked her to keep this as well.  In addition, she has some nitroglycerin at home, and I have asked her to carry this at all times.  If she has chest tightness, then I have told her that she probably should take the nitroglycerin.  If she has no relief after three nitroglycerin, she should report immediately to an emergency facility.  If she begins to use nitroglycerin frequently, she needs to be advised by Dr. Tomie China for further evaluation.  Gastroenterology evaluation may be  necessary. DD:  03/04/01 TD:  03/05/01 Job: [id number]

## 2011-02-11 NOTE — Op Note (Signed)
[place]. [facility]  Patient:    SEPTEMBER, MORMILE                          MRN: [id number] Proc. Date: 11/07/00 Adm. Date:  [id number] Attending:  Charlett Lango                           Operative Report  PREOPERATIVE DIAGNOSES:  Asymptomatic severe left internal carotid artery stenosis and coronary artery disease.  POSTOPERATIVE DIAGNOSES:  Asymptomatic severe left internal carotid artery stenosis and coronary artery disease.  PROCEDURE:  Left carotid endarterectomy and Dacron patch angioplasty, followed by coronary artery bypass grafting to be dictated by Dr. Charlett Lango.  SURGEON:  Larina Earthly, M.D.  ASSISTANT:  Salvatore Decent. Dorris Fetch, M.D. and [employer] Gold, P.A.-C.  ANESTHESIA:  General endotracheal.  COMPLICATIONS:  None.  PROCEDURE IN DETAIL:  The patient was taken to the operating room, placed in position, where the area of the left neck, chest and legs were prepped and draped in usual sterile fashion.  Incision was made anterior sternocleidomastoid and carried down through the platysma with electrocautery. Sternocleidomastoid was reflected posteriorly and the carotid sheath was opened.  The facial vein was ligated with 2-0 silk ties and divided.  The common carotid artery was encircled with an umbilical tape and Rumel tourniquet.  The vagus nerve and hypoglossal nerve were identified and preserved.  The superior thyroid artery was encircled with a 2-0 silk Potts tie.  The external carotid was encircled with a blue vessel loop and the internal carotid was encircled with an umbilical tape and Rumel tourniquet. Patient was given 7000 units of intravenous heparin.  After adequate circulation time, the internal and external common carotid arteries were occluded.  The common carotid artery was opened with an 11-blade and extended longitudinally with Potts scissors through the plaque on to the internal carotid with Potts scissors.  A 10  shunt was passed up the internal carotid artery, allowed to backbleed, then down the common carotid, where it was secured with Rumel tourniquets.  The endarterectomy was begun on the common carotid artery and the plaque was divided proximally with Potts scissors.  The endarterectomy was carried on to the bifurcation.  The external carotid was then endarterectomized with eversion technique and the internal carotid artery was endarterectomized in an open fashion.  The remaining atheromatous debris was removed from the endarterectomy plane.  The Finesse Hemashield Dacron patch was brought onto the field and was sewn as a patch angioplasty with a running 6-0 Prolene suture.  Prior to completion of the anastomosis, the shunt was removed and usual flush maneuvers were undertaken.  The anastomosis was then completed and the external, followed by the common, finally the internal carotid artery occlusion clamps were removed.  Excellent flow characteristics were noted with hand-held Doppler in the internal and external carotid arteries.  The patient was then prepared for coronary artery bypass graft surgery.  The wound was temporarily closed with Raytec placed over the endarterectomy patch and the skin was closed with interrupted 2-0 nylon mattresses, which were replaced at the end of the coronary artery bypass grafting with standard closure. DD:  11/07/00 TD:  11/07/00 Job: [id number]

## 2011-02-11 NOTE — Discharge Summary (Signed)
NAME:  [name], [name]                           ACCOUNT NO.:  [id number]   MEDICAL RECORD NO.:  [id number]                   PATIENT TYPE:  INP   LOCATION:  2040                                 FACILITY:  [facility]   PHYSICIAN:  Nicki Martin, M.D.                  DATE OF BIRTH:  12-11-1945   DATE OF ADMISSION:  06/01/2003  DATE OF DISCHARGE:  06/05/2003                                 DISCHARGE SUMMARY   DISCHARGE DIAGNOSES:  1. Coronary artery disease status post coronary artery bypass graft in 2002.  2. Peripheral vascular disease status post left carotid endarterectomy at     the time of coronary artery bypass graft in 2002.  3. Status post catheterization this admission; no intervention and no     significant restenosis.  4. Hypertension.  5. Hyperlipidemia.  6. Adult onset diabetes mellitus.  7. Obstructive sleep apnea.  8. Gastroesophageal reflux disease.  9. Status post remote cholecystectomy.   HISTORY OF PRESENT ILLNESS AND HOSPITAL COURSE:  This is a 65 year old  patient of [name] who is admitted through [facility] emergency  room with complaints of shortness of breath and presyncopal episode.  At the  time of admission she was found to be in congestive heart failure and the  question was raised if it was related to an underlying ischemia.  The  patient was started on gentle diuresis and scheduled for cath on June 03, 2003. She responded to diuretics well and her clinical condition  significantly improved.   Hospital procedure:  Cardiac cath on June 03, 2003 by [name] showed  no significant restenosis at the site of prior cutting balloon angioplasty  in the saphenous vein graft.  She had multiple scattered, but noncritical  lesions; and, the recommendations post cath were for medical therapy and  EACP.  The next day the patient was found to be stable from a cardiovascular  standpoint, but she complained of nausea and vomiting.  We checked her  amylase and lipase, and both values were negative.  Gastrointestinal consult  was obtained and Dr. Madilyn Martin saw the patient for nausea and vomiting, and he  thought this was related to possible diabetic gastroparesis.  Reglan was  ordered as well as Zofran.  Her condition improved by the time of discharge  and diet was gradually advanced.  The next day she was free of chest pain,  and nausea and vomiting had subsided.  She was considered to be stable for  discharge home.   LABORATORY DATA AND PERTINENT STUDIES:  CMP showed sodium 139, potassium  3.4, which was repleted, chloride 100, CO2 24, glucose 56, BUN 19, and  creatinine 1.0.  Lipase was 710 and amylase 39.  White blood cell count 5.4,  hemoglobin 12.9, hematocrit 35.6, and platelet count 160,000.  Cardiac  enzymes were negative times three.  Lipid profile showed cholesterol total  118m triglycerides 157,  HDL 7 and LDL 66.  D-dimer was normal 0.45.  Chest  x-ray did not show any acute disease, but showed pulmonary vascular  congestion and chronic venous hypertension, but no definitive evidence of  frank pulmonary edema.   DISPOSITION:  The patient was discharged home in stable condition.   DISCHARGE MEDICATIONS:  1. Plavix 75 mg daily.  2. Coreg 12.5 mg b.i.d.  3. Nexium 40 mg daily.  4. Actos 30 mg daily.  5. Lipitor 20 mg daily,  6. Glucotrol 20 mg daily.  7. Imdur 60 mg daily.  8. Altace 2.5 mg b.i.d.  9. Aspirin 81 mg daily.  10.      Lasix 80 mg daily.  11.      Potassium chloride 40 mEq daily.   ACTIVITY:  No driving.  No lifting greater than five pounds.  No strenuous  activities for three days.   DIET:  Low-cholesterol, low-fat diet.   WOUND CARE:  The patient is allowed to shower.   DISCHARGE INSTRUCTIONS:  The patient was instructed to report any problems;  our office number was provided.   FOLLOW UP:  [name] will see the patient on June 23, 2003 at 3:30  P.M.        Katie Mutton, P.A.                     Nicki Martin, M.D.    MK/MEDQ  D:  06/05/2003  T:  06/06/2003  Job:  [id number]   cc:   [facility]   [name], M.D.  [address]  [place]  [place] [address]  Fax: [phone]

## 2011-02-11 NOTE — Cardiovascular Report (Signed)
NAME:  [name], [name]                           ACCOUNT NO.:  [id number]   MEDICAL RECORD NO.:  [id number]                   PATIENT TYPE:  INP   LOCATION:  2040                                 FACILITY:  [facility]   PHYSICIAN:  Nicki Guadalajara, M.D.                  DATE OF BIRTH:  1946/07/12   DATE OF PROCEDURE:  11/05/2002  DATE OF DISCHARGE:                              CARDIAC CATHETERIZATION   INDICATION:  The patient is a 65 year old female who is status post remote  anterolateral MI treated with initial PTCA in December 2001, which had been  done by Dr. Emilie Rutter. Pulsipher of [facility].  Subsequently, the  patient required repeat intervention on November 03, 2000 by Dr. Juanda Chance for  restenosis.  The patient shortly thereafter, ultimately was referred for  bypass surgery and bypass surgery was done by Dr. Viviann Spare C. Hendrickson in  February 2002.  The patient was referred to our group at [facility] on November 03, 2002 after experiencing recurrent  symptoms of chest pain suggesting unstable angina.  Diagnostic  catheterization was done by me yesterday, November 04, 2002.  Please refer to  that dictation report.  The patient is now scheduled for elective coronary  intervention of the vein graft to the diagonal vessel, which is a Y graft,  with 75% stenosis occurring immediately beyond the SVG takeoff from the Y  component of the graft which supplied the OM-1 and OM-2 vessel.  The OM-1  branch was found to be occluded yesterday.  The patient is now referred for  intervention to the 75% stenosis in the graft to the diagonal.   PROCEDURE:  After premedication with Valium 5 mg intravenously, the patient  was prepped and draped in usual fashion.  The right femoral artery was  punctured anteriorly and a 7-French sheath was inserted.  The patient had  been given 150 mg of Plavix yesterday and received an additional 150 mg  prior to the procedure.  Baseline ACT  was 136 and the patient was given 4000  units of weight-adjusted heparin and was given double-bolus Integrilin and  started on an Integrilin infusion.  Initially, an FR-4 guide was used but  backup did not seem like it would be appropriate and this was exchanged for  a left bypass guide.  IC nitroglycerin was administered down the saphenous  vein graft to make certain that the stenosis was not due to focal spasm,  which confirmed that this was an obstructive lesion.  A Patriot wire was  advanced down the vein graft to the diagonal vessel.  Cutting balloon  arthrotomy was done initially with a 3.0 x 10.0-mm cutting balloon with  sequential cuts at 2, 3, 4 and ultimately 5 atmospheres at the bifurcation  site beyond the graft.  A 3.25 x 12.0-mm Quantum balloon was then inserted  with dilatation up  to 3.25 mm at 12 atmospheres.  Scout angiography  confirmed an excellent angiographic result.  During the procedure, the  patient did have some low back spasm and received additional Valium as well  as 2 mg of Versed and 2 mg of Nubain.  ACT was documented to be therapeutic.  The patient tolerated the procedure well and returned to the recovery room  in satisfactory condition.   HEMODYNAMIC DATA:  Central aortic pressure was 150/79, mean 113.  During the  procedure, the patient received 600 mcg of intracoronary nitroglycerin.   ANGIOGRAPHIC DATA:  At start of the procedure, selective angiography in the  Y graft showed a bifurcation of this graft proximally, with one limb  supplying the OM-1 and OM-2 vessels sequentially, but as previously noted,  the OM-1 limb was occluded and there was evidence for collaterals, probably  from the diagonal and from the OM-2 vessel supplying this OM-1 vessel.  The  limb of the vein graft just beyond the saphenous vein to OM takeoff had  focal 75% stenosis; this did not improve with IC nitroglycerin  administration.  Following cutting balloon arthrotomy with a 3.0 x  10.0-mm  cutting balloon and subsequent dilatation with a 3.25 x 12.0-mm Quantum  balloon, the 75% stenosis was reduced to less than 10%.  There was no  evidence for dissection.  There was TIMI-3 flow in both limbs of the graft.    IMPRESSION:  Successful cutting balloon arthrotomy/percutaneous transluminal  coronary angioplasty of the saphenous vein Y graft involving the limb of the  graft supplying the diagonal vessel, with the 75% stenosis being reduced to  less than 10% (done with 3.0 x 10.0-mm cutting balloon arthrotomy catheter,  and 3.25 x 12.0-mm Quantum balloon; double-bolus Integrilin/weight-adjusted  heparinization.).                                               Nicki Guadalajara, M.D.    TK/MEDQ  D:  11/05/2002  T:  11/05/2002  Job:  [id number]   cc:   Rosalita Levan [facility]   Orville Govern

## 2011-02-11 NOTE — Discharge Summary (Signed)
NAME:  [name], [name]                 ACCOUNT NO.:  [id number]   MEDICAL RECORD NO.:  [id number]          PATIENT TYPE:  INP   LOCATION:  2017                         FACILITY:  [facility]   PHYSICIAN:  Mohan N. Sharyn Lull, M.D. DATE OF BIRTH:  1945-11-04   DATE OF ADMISSION:  03/26/2006  DATE OF DISCHARGE:  03/28/2006                                 DISCHARGE SUMMARY   ADMITTING DIAGNOSES:  1.  Chest pain, rule out myocardial infarction.  2.  Coronary artery disease.  3.  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  4.  Hypertension.  5.  Non-insulin-dependent diabetes mellitus.  6.  Hypercholesteremia.  7.  Morbid obesity.  8.  Chronic anemia.  9.  Positive family history of coronary artery disease.   DISCHARGE DIAGNOSES:  1.  Stable angina.  2.  Coronary artery disease.  3.  History of myocardial infarction, status post CABG.  4.  Hypertension.  5.  Non-insulin-dependent diabetes mellitus.  6.  Hypercholesteremia.  7.  Morbid obesity.  8.  Chronic anemia  9.  Positive family history of coronary artery disease.   DISCHARGE HOME MEDICATIONS:  Enteric-coated aspirin 81 mg 1 tablet daily,  Coreg 6.25 mg 1 tablet every 12 hours, Imdur 30 mg 1 tablet daily in the  morning, Altace 10 mg 1 capsule daily, Protonix 40 mg 1 tablet twice daily,  Vytorin 10/80 1 tablet daily, Glucotrol XL 10 mg 1 tablet daily as before,  Feosol 325 mg 1 tablet twice daily, Nitrostat 0.4 mg sublingual use as  directed.   DIET:  Low-salt, low-cholesterol, 1800 calories ADA diet.  The patient has  been advised to monitor blood sugar twice daily.  The patient has been  advised also to check CBC in about 1 week.   CONDITION AT DISCHARGE:  Stable.   Follow up with me in 1 week.   BRIEF HISTORY AND HOSPITAL COURSE:  [name] is a 65 year old white female  with past medical history significant for coronary artery disease; history  of MI, status post CABG; hypertension; non-insulin-dependent  diabetes  mellitus; hypercholesteremia; morbid obesity; degenerative joint disease;  chronic anemia; GERD.  She came to the ER complaining of retrosternal chest  pain described as sharp and pressure, grade 5/10, radiating to the right  shoulder and right arm, associated with nausea and palpitation.  Denies any  diaphoresis.  Denies shortness of breath.  States chest pain felt similar in  nature but was less severe when she had MI.  The patient was started on IV  nitrates in the ER with relief of chest pain.  Denies any PND, orthopnea,  leg swelling.  Denies relation of chest pain to food, breathing, cough.  Denies any fever or chills.   PAST MEDICAL HISTORY:  As above.   PAST SURGICAL HISTORY:  She had CABG and LIMA to LAD, sequential saphenous  vein graft to diagonal and obtuse marginal, saphenous vein graft to PDA in  2002.  She is also status post cholecystectomy, status post neck surgery x2,  and status post hysterectomy.   SOCIAL HISTORY:  She is married, has 1 child.  No history of smoking or  alcohol abuse.  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   FAMILY HISTORY:  Father died of MI in the age of 11.  Mother died of stroke  at the age of 56.  She was hypertensive.   ALLERGIES:  SHE IS ALLERGIC TO PHENERGAN.   MEDICATIONS AT HOME:  She is on enteric-coated aspirin 81 mg daily, Plavix  75 mg daily, Coreg 6.25 mg p.o. q.12 h., Altace 10 mg p.o. b.i.d., Zocor 40  mg p.o. daily, Zetia 10 mg p.o. daily, Protonix 40 mg p.o. daily, Glucotrol  XL 10 mg p.o. daily.   PHYSICAL EXAMINATION:  GENERAL:  On examination, she is alert, awake,  oriented x3 in no acute distress.  VITAL SIGNS:  Blood pressure was 143/72, pulse was 73, regular.  HEENT:  Conjunctivae were pink.  NECK:  Supple, no JVD, no bruit.  CHEST:  Lungs were clear to auscultation without rhonchi or rales.  CARDIOVASCULAR EXAM:  S1 and S2 were normal.  There was soft systolic  murmur.  There was no  S4 gallop.  ABDOMEN:  Soft.  Bowel sounds were present, nontender.  EXTREMITIES:  There is no clubbing, cyanosis or edema.   EKG done in the ER showed normal sinus rhythm with nonspecific T-wave  changes.   LABORATORIES:  Point of care cardiac markers:  CPK-MB was less than 1.0,  Troponin I was less than 0.05.  Cholesterol was 153; LDL was 94, which was  slightly elevated; HDL was 45.  Her hemoglobin A1c was 6.2.  Total CK was  76, MB 0.8.  Second set:  CK 68, MB 0.9, Troponin I was 0.01 and 0.01.  Her  sodium was 139, potassium 4.9, chloride 109, bicarb 26, glucose 98, BUN 19,  creatinine 1.1.  Hemoglobin was 10.2, hematocrit 29.8, white count of 4.1.  Repeat hemoglobin on July 2 was 9.7, hematocrit 27.5.   BRIEF HISTORY AND HOSPITAL COURSE:  The patient was admitted to telemetry  unit and was started on IV nitrates and continued on the rest of her  medication.  MI was ruled out by serial enzymes and EKG.  Patient did not  have any further episodes of chest pain during the hospital stay.  Patient  underwent Persantine Myoview on July 2, which showed no evidence of  reversible ischemia with EF of 54%.  Patient's hemoglobin has dropped from  10.2 to 9.3.  Yesterday's hemoglobin was 9.7, most likely this is due to  hydration.  Patient denies any episodes of bleeding from any site.  Patient  states she had extensive workup for anemia as outpatient in the past, which  has been negative.  Patient's platelet count today has gone down to 130,000.  We will DC.  Patient is eager to go home and wanted to get anemia workup as outpatient.  We will DC her Plavix in view of thrombocytopenia. We will check a CBC in 1  week.  Patient will follow up with me in 1 week and with GI in 2 weeks.           ______________________________  Eduardo Osier Sharyn Lull, M.D.     [facility]/MED[facility]  D:  03/28/2006  T:  03/28/2006  Job:  [id number]  cc:   Eduardo Osier. Sharyn Lull, M.D.  Fax: [phone]

## 2011-02-11 NOTE — Op Note (Signed)
[place]. [facility]  Patient:    [name], [name]                          MRN: [id number] Proc. Date: 11/07/00 Adm. Date:  [id number] Attending:  Charlett Lango CC:         Belva Crome, M.D., [place]  Dr. Sherran Needs [place]   Operative Report  PREOPERATIVE DIAGNOSIS:  Three-vessel coronary disease with unstable angina.  POSTOPERATIVE DIAGNOSIS:  Three-vessel coronary disease with unstable angina.  OPERATION PERFORMED:  Median sternotomy, extracorporeal circulation, coronary artery bypass grafting x 5 (left internal mammary artery to left anterior descending, saphenous vein graft to first diagonal, sequential saphenous vein graft to obtuse marginal 1 and 2, saphenous vein graft to the posterior descending).  SURGEON:  Salvatore Decent. Dorris Fetch, M.D.  ASSISTANT: 1. Salvatore Decent. Cornelius Moras, M.D. 2. Dominica Severin, [place]  ANESTHESIA:  General.  OPERATIVE FINDINGS:  LAD intramyocardial good quality target but significant disease distally.  First diagonal small poor quality.  OM1 small fair quality. OM2 good quality.  Posterior descending good quality.  Second diagonal and OM3 too small to graft.  Good quality conduits.  INDICATIONS FOR PROCEDURE:  The patient is a 65 year old white female with type 2 diabetes, hypertension and hypercholesterolemia.  She had a significant anterior myocardial infarction in December of 2001.  She now presents with unstable angina.  She ruled out for myocardial infarction.  She underwent cardiac catheterization which showed three-vessel disease with an ejection fraction of approximately 50% which was greatly improved from December.  She underwent PTCA of a complex proximal LAD lesion at the take off of the first diagonal but continued to have unstable symptoms and was referred for bypass grafting.  The indications, risks, benefits and alternative treatments were discussed in detail with the patient.  She understood and accepted  the risks and agreed to proceed.  During her preoperative evaluation, carotid Dopplers revealed a tight left internal carotid artery stenosis.  The patient was asymptomatic but the lesion was of significant severity to warrant vascular surgery consult from Dr. Tawanna Cooler Early who recommended concomitant left carotid endarterectomy.  The patient understood the indication for combined surgery and the risks and benefits of the combined procedure as well.  She agreed to proceed.  DESCRIPTION OF PROCEDURE:  [name] was brought to the preop holding area on November 07, 2000.  Lines were placed to monitor arterial, central venous and pulmonary arterial pressure.  EKG leads were placed for continuous telemetry. The patient was taken to the operating room, anesthetized and intubated.  A Foley catheter was placed intravenous antibiotics were administered.  The chest, abdomen and legs were prepped and draped in the usual fashion.  The head was turned to the right and the left neck was prepped and draped into the sterile field as well.  Dr. Tawanna Cooler Early then performed a left carotid endarterectomy.  The description of the procedure is dictated in a separate operative note.  At the completion of this procedure, the wound was packed with gauze and closed with interrupted vertical mattress sutures for hemostasis during the performance of bypass grafting.  A median sternotomy was performed and the left internal mammary artery was harvested in the standard fashion.  The patient was given a full dose of heparin prior to dividing the distal end of the mammary artery.  There was good flow through the cut end of the conduit.  The mammary was placed in a  papaverine soaked sponge and placed into the left pleural space. Simultaneously during the performance of the carotid endarterectomy and takedown of the mammary artery, the greater saphenous vein was harvested from the medial aspect of the right leg.  The greater  saphenous vein was of relatively small size but of good quality but it was of adequate size for bypass grafting.  The pericardium was opened.  The ascending aorta was inspected and palpated. There was no palpable atherosclerotic disease.  The aorta was cannulated via concentric 2-0 Ethibond pledgeted pursestring sutures.  The dual stage venous cannula was placed via pursestring suture in the right atrial appendage. Cardiopulmonary bypass was instituted and the patient was cooled to 32 degrees Celsius.  The coronary arteries were inspected and anastomotic sites were chosen.  The conduits were inspected and cut to length.  A foam pad was placed in the pericardium to protect the left phrenic nerve.  A temperature probe was placed in the myocardial septum and a cardioplegia cannula was placed in the ascending aorta.  The aorta was crossclamped.  The left ventricle was emptied via the aortic root vent.  Cardiac arrest then was achieved with a combination of cold antegrade blood cardioplegia and topical iced saline.  600 cc of cardioplegia was administered.  The myocardial septal temperature was 9 degrees Celsius.  The following distal anastomoses were performed.  First a reversed saphenous vein graft was placed end-to-side to the posterior descending branch of the right coronary artery.  The posterior descending came off the acute margin and then took a transverse course along the inferior wall.  The vessel was grafted just beyond the acute margin of the heart.  It was 1.5 mm in diameter of good quality at the site of the anastomosis.  The anastomosis was performed with a running 7-0 Prolene suture in an end-to-side fashion.  There was good flow through the graft.  Cardioplegia was administered and there was good hemostasis.  Next, a reversed saphenous vein graft was placed sequentially to the first and second obtuse marginal branches of the left circumflex coronary artery.  The third obtuse  marginal branch which had a 90% stenosis was less than 1 mm in  diameter, very high in the AV groove and not graftable.  The side-to-side anastomosis to the first obtuse marginal was performed off the side branch of the vein graft.  OM1 was a 1 mm vessel.  It was of fair quality.  The anastomosis was probed proximally and distally with a 1 mm probe to ensure patency at its completion.  Next, an end-to-side anastomosis was performed to OM2.  This was a 1.5 mm good quality target.  This vessel had approximately 50% proximal stenosis.  The anastomosis again was probed proximally and distally to ensure patency before tying the suture.  There was good flow through this graft.  Cardioplegia was administered down both vein grafts. There was good hemostasis at both OM anastomoses.  Next, a reversed saphenous vein graft was placed end-to-side to the first diagonal branch of the LAD.  The first diagonal had a 70% proximal stenosis and a poststenotic dilatation.  It was a diffusely diseased vessel.  It was 1 mm in diameter and of poor quality.  The anastomosis was performed with a running 8-0 Prolene suture because of the poor quality of the target vessel. A probe was passed proximally and distally to ensure patency at the completion of the anastomosis.  Flow was better than expected.  Cardioplegia was administered and  there was a small leak from the heal which was repaired with an 8-0 Prolene suture.  The left internal mammary artery then was brought through a window in the pericardium and the distal end of the mammary was spatulated.  It was a 2 mm good quality conduit.  The LAD was identified distally.  There was a long intramyocardial segment of the LAD.  The dissection was carried from the distal end back proximally through the myocardium to a site that was suitable for grafting.  The LAD was a 2 mm vessel at the site of the anastomosis.  It did narrow distally and had significant disease  distally but a 1.5 mm probe passed easily proximally from the site of the anastomosis.  The LIMA to LAD anastomosis was performed with a running 8-0 Prolene suture in an end-to-side fashion.  At the completion of the anastomosis, the bulldog clamp was removed from the mammary artery.  Immediate and rapid septal rewarming was noted. Lidocaine was administered.  There was good hemostasis.  The mammary pedicle was tacked to the epicardial surface of the heart with 6-0 Prolene sutures. The mammary pedicle was inspected and showed that there was no kink in the mammary due to the relatively deep intramyocardial location of the target. The aortic crossclamp was removed.  The patient fibrillated and required a single defibrillation with 20 joules.  A partial occlusion clamp was placed on the ascending aorta.  The vein grafts were cut to length.  Of note, because of trimming of a segment of the diagonal vein graft which had a leak due to a torn branch, it was not long enough to reach the ascending aorta and it was brought off a side branch of the OM sequential vein graft.  The OM vein graft and PDA vein graft proximal anastmooses were performed to 4.0 mm punch aortotomies.  The ascending aorta was of good quality, thin-walled with no atherosclerosis at the site of the anastomoses.  At the completion of the final proximal anastomosis, the patient was placed in Trendelenburg position.  Air was allowed to vent as the partial clamp was removed.  The proximal anastomoses were inspected for hemostasis. Bulldog clamps then were replaced proximally and distally on the OM vein.  A side branch was excised and the venotomy was lengthened to match the size of the proximal aspect of the diagonal vein graft which was anastomosed end-to-side proximally using a running 7-0 Prolene suture.  The distal bulldog clamps were removed first to allow backbleeding to deair the anastomosis before removing the proximal  bulldog clamp.  At this point flow had been restored through all vein grafts.  All proximal and distal anastomoses were inspected for hemostasis.  The patient was being rewarmed during the proximal anastomoses.  Epicardial pacing wires were placed on the right ventricle and right atrium.  The patient was weaned from cardiopulmonary bypass when the core temperature was 37 degrees Celsius.  The patient was initially on milrinone but that was discontinued and the patient was hemodynamically stable although she did remain tachycardic in the early postoperative period.  A test dose of protamine was administered and was well tolerated.  The atrial and aortic cannulae were removed.  There was good hemostasis of both cannulation sites.  The remainder of the protamine was administered without incident.  The chest was irrigated with 1L of warm normal saline containing 1 gm of Vancomycin.  Hemostasis was achieved.  A left pleural and two mediastinal chest tubes were placed  through separate subcostal incisions and secured with #1 silk sutures.  The pericardium was reapproximated with interrupted 3-0 silk sutures.  It came together easily without tension.  The sternum was closed with heavy gauge interrupted stainless steel wires.  Pectoralis fascia was closed with a running #1 Vicryl suture.  The subcutaneous tissue was closed with a running 2-0 Vicryl suture and the skin was closed with a 3-0 Vicryl subcuticular suture.  The sutures were removed and packing was  removed from the neck incision.  There was some bleeding from the skin and subcutaneous tissues.  This was controlled with electrocautery.  The anastomosis was inspected and had good hemostasis.  There was minimal capillary bleeding in the neck; however, a #10 flap Jackson-Pratt drain was placed through a separate stab wound incision.  The platysma was closed with a running 2-0 Vicryl suture.  The subcutaneous tissue was closed with a running  3-0 Vicryl suture and the skin was closed with a 3-0 Vicryl subcuticular suture.  Sponge, needle and instrument counts were correct at the end of the procedure.  There were no intraoperative complications.  The patient was taken from the operating room to the surgical intensive care unit intubated and in stable condition. DD:  11/07/00 TD:  11/08/00  Job: [id number]

## 2011-02-11 NOTE — Cardiovascular Report (Signed)
[place]. [facility]  Patient:    [name], [name]                          MRN: [id number] Proc. Date: 11/03/00 Adm. Date:  [id number] Attending:  Dietrich Pates V CC:         Dr. Parke Poisson, M.D. [facility]  Cath Lab   Cardiac Catheterization  INDICATIONS:  Ms. Ha is 65 years old and was hospitalized here on December 29, with an acute anterior wall myocardial infarction which was treated late with PTCA of the left anterior descending.  She had a small vessel that was heavily calcified and subsequently she was treated only with PTCA.  She did have some residual disease in the circumflex and had severe left ventricular dysfunction with ejection fraction of 24%.  She did fairly well until yesterday when she developed recurrent chest pain and she was admitted last night to [facility].  Her EKG showed new T wave changes in the anterolateral leads.  Her enzymes were negative.  Dr. Sherril Croon called [place] and referred her for further evaluation.  DESCRIPTION OF PROCEDURE:  The procedure was performed via the right femoral artery using arterial sheath and 6 French preformed coronary catheters. A front-wall arterial puncture was performed and Omnipaque contrast was used. After completion of the diagnostic study, we made the decision to proceed with intervention of the LAD.  The patient was given weight adjusted heparin and following an ACT greater than 200 seconds and was given double bolus Integrilin and infusion.  We used a 6 [place] JL3 guiding catheter and a short floppy wire.  We crossed the lesion in the proximal LAD without too much difficulty.  There was heavy calcification and tortuosity in the proximal vessel and the vessel was a small caliber vessel.  We initially went in with a 2.25 x 20 mm crossail balloon and we performed a total of 6 inflations up to 9 atmospheres for 63 seconds.  There was difficulty even advancing the balloon through the  tortuous calcified proximal LAD.  We initially thought about stenting it with a small caliber stent, but decided against this for two reasons.  Access would have been extremely difficult or impossible because of the calcification and tortuosity.  It also would have required a long stent of a small caliber which would have had a high incidence of recurrence.  The patient tolerated the procedure well and left the laboratory in satisfactory condition.  RESULTS:  The left main coronary artery was free of significant disease.  The left anterior descending artery gave rise to a septal perforator, first diagonal branch, and a second diagonal branch.  The LAD was heavily calcified in its proximal portion and quite tortuous.  There was a 70% lesion in the proximal vessel.  There was another 80% lesion just after the diagonal branch in the proximal vessel, and there was another 70% lesion in the proximal vessel just after the septal perforator with a diffusely diseased proximal vessel.  The first diagonal branch had an aneurysmal dilatation at its ostium and then a 70% stenosis.  The second small diagonal branch had multiple 90% stenoses.  There was also an 80% stenosis in the distal LAD.  The circumflex artery gave rise to a moderate size marginal branch, large marginal branch, posterolateral, and a posterior descending branch.  There was 90% ostial stenosis in the first marginal branch.  There was 80% stenosis in the  posterolateral branch.  The right coronary artery is a nondominant vessel that had a 50% ostial stenosis and 40% segmental disease in its proximal portion.  The left ventriculogram performed in the RAO projection showed akinesis of the tip of the apex, but the anterolateral wall moved fairly well and the inferior wall moved well.  This was much improved from the acute study done in December.  The estimated ejection fraction was 50% compared to 24% before.  Following PTCA of the  lesions in the proximal LAD, the first stenosis improved from 70% to 20%, the second stenosis improved from 80% to 40%, and the third stenosis improved from 70% to 40%.  CONCLUSION: 1. Coronary artery disease status post prior anterior wall myocardial    infarction treated with primary percutaneous transluminal coronary    angioplasty September 23, 2000, with 70, 80, and 70% restenoses in the    proximal left anterior descending, 70% stenosis in the first diagonal    branch, and multiple 90% stenoses in a small second diagonal branch with    80% distal disease, 90% stenosis in the first marginal branch of the    circumflex artery with 80% stenosis in a posterolateral branch, and 50%    stenosis in the nondominant right coronary artery with apical wall    akinesis. 2. Successful angioplasty of the three tandem lesions in the proximal LAD    with improvement in the first lesion from 70% to 20%, improvement in the    second lesion from 80% to 40%, and improvement in the third lesion from    70% to 40%.  DISPOSITION:  The patient will return to postanesthesia unit for further observation.  She will have a moderately high incidence of recurrence of her lesion because of the diffuse disease and small caliber vessel.  If her disease comes back, the choices for therapy will be difficult.  One choice would be bypass surgery.  Another choice would be to assess the vessel with IVUS and consider rotational atherectomy. DD:  11/03/00 TD:  11/05/00 Job: [id number]

## 2011-02-11 NOTE — Discharge Summary (Signed)
NAME:  [name], [name]                 ACCOUNT NO.:  [id number]   MEDICAL RECORD NO.:  [id number]          PATIENT TYPE:  INP   LOCATION:  3733                         FACILITY:  [facility]   PHYSICIAN:  Michaelyn Barter, M.D. DATE OF BIRTH:  [birth year]   DATE OF ADMISSION:  03/24/2005  DATE OF DISCHARGE:  03/28/2005                                 DISCHARGE SUMMARY   PRIMARY CARE PHYSICIAN:  Dr. Kern Reap.   FINAL DIAGNOSES AT THE TIME OF DISCHARGE:  Chest pain.   SECONDARY DIAGNOSIS:  Diabetes mellitus.   HISTORY OF PRESENT ILLNESS:  [name] is a 65 year old female with a past  medical history of coronary artery disease, status post CABG and left  ventricular dysfunction who arrived with the chief complaint of chest pain  and left shoulder pain.  She stated that she had been at the funeral of her  nephew the day of her admission and during that time she developed pain over  her left shoulder.  She also complained of some chest pain.  She became  diaphoretic and nauseated and subsequently developed difficulty breathing  along with palpitations.  The symptoms associated with her chest pain lasted  for only 5 minutes; however, her left shoulder pain persisted.  Therefore,  she came to the hospital's emergency room for further evaluation.   PAST MEDICAL HISTORY:  1.  Coronary artery disease status post CABG.  2.  Hypertension.  3.  Hyperlipidemia.  4.  Diabetes mellitus.  5.  Anemia.  6.  Cardiac catheterization completed September 2005 by Dr. Jenne Campus showing      a patent left internal mammary artery to the left anterior descending      and patent saphenous vein graft to the right coronary artery.  However,      it was noted that the patient did have some occlusions involving the      saphenous vein graft to the diagonal and obtuse marginal for which      medical therapy was recommended.   FAMILY HISTORY:  Father died of an MI at age 13.  Mother died from a stroke  at age  82.   SOCIAL HISTORY:  Cigarettes:  The patient denies.  Alcohol:  The patient  denies.   HOSPITAL COURSE:  1.  Chest pain.  The patient was admitted to rule out myocardial infarction.      She was admitted to telemetry and her serial enzymes were checked.  The      patient's CK MBs were noted to be 0.7, 0.7, 0.7.  She had a Troponin I      completed as the result of that, was 0.03, all of which were within      normal range.  The patient's chest pain and left shoulder pain did      persist over the following day of her admission and cardiology was      consulted.  [facility] saw the patient and decided to perform a      stress exam on the patient.  On March 27, 2005 the patient underwent a  nuclear medicine myocardial imaging with SPECT stress test, the final      impression of which were:      1.  No definite inducible R reversible ischemia pharmacologically.          Limited exam because of the breast attenuation and the greater rest          images.      2.  Mild septal hypokinesis as described.      3.  Ejection fraction of 55%.  Following the completion of the test, the patient stated that her chest pain  had completely resolved.  Likewise, she was no longer complaining of any  pain related to her left shoulder.  The patient was noted to ambulate around  the nursing station and floor multiple times over the course of the  remaining days of her hospitalization and she appeared to be without any  type of stress or discomfort.  On July 3, the patient underwent a 2-D  echocardiogram, the results of which were:  Overall left ventricular  function was mildly decreased.  Left ventricular ejection fraction was  estimated to range between 40-50%.  Left atrial size is at the upper limits  of normal.  Right ventricular size is at the upper limits of normal.  Again,  the patient has been without chest pain over the last 2 days of her  admission to the hospital.  1.  Diabetes  mellitus.  The patient does have a history of diabetes mellitus      and over the course of her hospitalization Accu-Cheks were performed on      a daily basis.  Her sugars remained stable over the course of her      hospitalization.   CONDITION ON DISCHARGE:  On the date of discharge, the patient states that  she feels fine and states that she is ready to go home.  Her vitals on the  date of discharge:  Her temperature is 98.5, heart rate 71, respirations 20,  blood pressure 118/58.  Her CBGs were 147, 84, and 122 respectively.  She is  saturating 100% on room air.  The decision has been made to discharge the  patient home.  The patient will be discharged home on the following  medications:  1.  Aspirin 81 mg p.o. daily.  2.  Coreg 6.25 mg p.o. b.i.d.  3.  Plavix 75 mg p.o. daily.  4.  Zetia 10 mg p.o. daily.  5.  Lasix 20 mg p.o. daily.  6.  Glucotrol 10 mg p.o. daily.  7.  New Iron 150 mg p.o. b.i.d.  8.  Protonix 40 mg p.o. b.i.d.  9.  Altace 20 mg p.o. daily.  10. Zocor 40 mg p.o. daily.   FOLLOW UP:  She will be instructed to follow up with her primary care  physician, Dr. Kern Reap, within 3-4 weeks.   DISPOSITION:   DISCHARGE MEDICATIONS:   SUMMARY:       OR/MEDQ  D:  03/28/2005  T:  03/28/2005  Job:  [id number]   cc:   Olene Craven, M.D.  [address]  [address]  [place]  [place] [address]  Fax: [phone]

## 2011-02-11 NOTE — Procedures (Signed)
[facility]  Patient:    [name], [name]                          MRN: [id number] Proc. Date: 07/07/00 Adm. Date:  [id number] Disc. Date: [id number] Attending:  Louie Bun CC:         Stacie Acres. Cliffton Asters, M.D.   Procedure Report  PROCEDURE:  Esophagogastroduodenoscopy.  ENDOSCOPIST:  Everardo All. Madilyn Fireman, M.D.  INDICATION FOR PROCEDURE:  Persistent dyspepsia described as dysphagia with abdominal burning and difficulty in swallowing both liquids and solids. Patient has known diabetic gastroparesis and this has responded well to Reglan in the past.  She did have food in her stomach at the time of her last endoscopy and seemed to respond to an empiric dilatation.  Procedure is to reassess for her current symptoms being associated with retained food in the stomach, consider repeat dilatation given the dysphagia and to assess Helicobacter status.  DESCRIPTION OF PROCEDURE:  The patient was placed in the left lateral decubitus position and placed on the pulse monitor with continuous low-flow oxygen delivered by nasal cannula.  She was sedated with 60 mg IV Demerol and 6 mg IV Versed.  The Olympus video endoscope was advanced under direct vision into the oropharynx and esophagus.  The esophagus was straight and of normal caliber, with the squamocolumnar line at 38 cm.  There was no visible hiatal hernia or ring, stricture or other abnormality at the GE junction, although there may have been some dilatation of the distal esophagus.  The stomach was entered and a small amount of liquid secretions were suctioned from the fundus.  There was no retained food in the stomach.  Retroflexed view of the cardia was unremarkable.  The fundus and body appeared normal.  The antrum showed some mild erythema and granularity consistent with a mild gastritis. Pylorus was non-deformed and easily allowed passage of the endoscope tip into the duodenum.  Both bulb and second portion  were well-inspected and appeared to be within normal limits.  The endoscope was withdrawn back in the stomach and CLOtest withdrawn.  A Savary guidewire was placed through the endoscope channel and a single 18-mm Savary dilator was passed to minimal resistance, with no blood seen on withdrawal.  The dilator was removed together with the wire and the patient returned to the recovery room in stable condition.  She tolerated the procedure well and there were no immediate complications.  IMPRESSION:  Mild antral gastritis; otherwise, essentially normal endoscopy.  PLAN:  Given the dysphagia and abnormality seen on previous barium swallow, if she has a negative CLOtest and does not respond to todays dilatation, we will obtain an esophageal manometry to assess for possible early achalasia. DD:  07/07/00 TD:  07/10/00 Job: [id number]

## 2011-02-11 NOTE — Discharge Summary (Signed)
NAME:  [name], [name]                           ACCOUNT NO.:  [id number]   MEDICAL RECORD NO.:  [id number]                   PATIENT TYPE:  INP   LOCATION:  6531                                 FACILITY:   PHYSICIAN:  Lezlie Octave, N.P.                  DATE OF BIRTH:  June 15, 1946   DATE OF ADMISSION:  11/03/2002  DATE OF DISCHARGE:  11/06/2002                                 DISCHARGE SUMMARY   HISTORY OF PRESENT ILLNESS:  Ms. [name] is a 65 year old married, white  female patient who came to the emergency room with complaints of chest pain.  Apparently the day prior at 4 p.m. she had some chest pain lasting three  minutes, it occurred on and off for hours.  It radiated down her left arm to  her shoulder.  She had some nausea, no shortness of breath.  She has had  some tingling in her face and her mouth was numb.  She had some diaphoresis.  This occurred about 20 times since the time it started to the time she was  admitted.  She then went to church on the morning of admission, she had the  same symptoms, they were somewhat more severe plus these were the same  symptoms previous to her MI in the past and thus she came to the emergency  room.   PAST MEDICAL HISTORY:  Included hypertension and onset diabetes mellitus 2,  GERD, dyslipidemia, osteoarthritis, she has had a cholecystectomy, two laser  eye surgeries, and cervical neck surgery.  She has also had ASCVD with  anterior wall myocardial infarction and LAD PTCA on 09/23/00.  She had a  stent placed also to her LAD, I believe in the year 2002.  She underwent  coronary artery bypass grafting x5 by Dr. Dorris Fetch with a LIMA to her LAD  and saphenous vein graft to her first diagonal, sequential SVG to her OM-1  and 2, and SVG to her PDA.  At the time of surgery she also had left carotid  endarterectomy with Dacron patch angioplasty by Dr. Charlett Lango.   HOSPITAL COURSE:  She was admitted to the hospital.  She was put  on IV  heparin.  She underwent cardiac catheterization on 11/04/02 by Dr. Nicki Guadalajara.  The lead to her LAD was patent with no distal LAD disease.  Her  saphenous vein graft to her diagonal-1 was about 75% stenosed, right after  the Y graft which went sequentially to her OM-1 and OM-2, her OM-1 was  totally occluded, her OM-2 was patent.  Circumflex had some minimal  stenosis, 20 and 30, 40%.  Her SVG to her RCA was patient.  Dr. Tresa Endo  reviewed the films with his colleagues and it was decided that he would do a  PTCA to her saphenous vein graft to her diagonal.  This was performed on  11/05/02.  He  also recommended that is she had further chest pain that she  should undergo EECP.  A fasting lipid profile was done at the time of her  admission and her total cholesterol was very high at 311 and her LDL was 223  thus she was put on Lipitor.  Her other medications were adjusted.   On the morning of 11/06/02 she was seen by Dr. Nanetta Batty.  She was  considered stable to be discharged home.  The patient prefers to follow up  with Dr. Tresa Endo at this point, thus appointments were made.   LABORATORY DATA:  Hemoglobin is 12.9, hematocrit was 37.4, WBC's 5000 and  platelets were 151,000.  Her sodium was 140, potassium 3.5, glucose 119,  chloride 107, CO2 was 25, BUN was 8, creatinine 0.1, calcium was 9.2.  CK-  MB's were negative x2 and troponins negative x2.  Total cholesterol was 311,  triglycerides were 148, HDL was 58, and LDL was 223, her VLDL was 30.  TSH  was 0.792.   DISCHARGE MEDICATIONS:  Coreg 12.5 mg b.i.d., Nexium 40 mg once per day,  Actos 30 mg once per day, Plavix 75 mg once per day, aspirin 81 mg once per  day, Altace 2.5 mg once per day, Imdur 30 mg once per day, Lipitor 20 mg  once per day, Glucophage as taken prior, she may start that on Friday.   DISCHARGE INSTRUCTIONS:  She is to do no strenuous activity, no driving, no  house work x4 days.  She should be on a low  saturated fat diet.  If she has  any problems with her groin she will call our office.  She will follow up  with Dr. Tresa Endo on 11/19/02 at 12 noon.   DISCHARGE DIAGNOSES:  1. Unstable angina.  2. Coronary artery disease.  3. Status post catheterization this admission with subsequent percutaneous     transluminal coronary angioplasty and cutting balloon to saphenous vein     graft to her diagonal-1.  She also has some residual disease, her first     obtuse marginal artery is occluded distal to the graft insertion.  She     had some un amenable blockages in her circumflex artery.  4. Status post coronary artery bypass grafting times five on 11/07/00 by Dr.     Charlett Lango with a LIMA to her left anterior descending and CT to     her first diagonal, sequential saphenous vein graft to her first obtuse     marginal artery and second obtuse marginal artery, and saphenous vein     graft to her posterior descending artery.  5. Adult-onset diabetes mellitus, type 2.  6. Dyslipidemia, started on statin medications with a very high HDL.  7. Hypertension currently controlled.  8. Normal left ventricular function.  9. Obesity.  10.      Gastroesophageal reflux disease.  11.      Osteoarthritis.                                                 Lezlie Octave, N.P.    BB/MEDQ  D:  11/06/2002  T:  11/06/2002  Job:  [id number]   cc:   Nanetta Batty, M.D.  [address]  [place]  [place] [address]  Fax: [phone]

## 2011-02-11 NOTE — H&P (Signed)
NAME:  [name], [name]                 ACCOUNT NO.:  [id number]   MEDICAL RECORD NO.:  [id number]          PATIENT TYPE:  EMS   LOCATION:  MAJO                         FACILITY:  [facility]   PHYSICIAN:  Mobolaji B. Bakare, M.D.DATE OF BIRTH:  1945-11-18   DATE OF ADMISSION:  03/24/2005  DATE OF DISCHARGE:                                HISTORY & PHYSICAL   PRIMARY CARE PHYSICIAN:  Olene Craven, M.D.   CARDIOLOGIST:  Darlin Priestly, M.D.   CHIEF COMPLAINT:  Left shoulder pain.   HISTORY OF PRESENT ILLNESS:  [name] is a 65 year old Caucasian female  with history of CAD status post CABG and left ventricular dysfunction with  an ejection fraction of 30-35%. She was at a nephew's funeral this afternoon  when she developed pain over her left shoulder radiating to her left arm.  There was some retrosternal chest pain as well, however, not as pronounced  as the left shoulder pain. She became diaphoretic and nauseous accompanied  with shortness of breath and palpitations. These symptoms lasted for less  than five minutes but the left shoulder pain persisted, hence she came to  the emergency department. EKG in the emergency department was unchanged from  previous, normal sinus rhythm, no evidence of acute ischemia. Cardiac  enzymes at point of care 4 sets were negative. She is currently not short of  breath. No more diaphoresis nor chest pain, hence she is being admitted for  evaluation and to rule out MI. In view of significant cardiac risk factors  which include type 2 diabetes mellitus, dyslipidemia, hypertension and CAD,  obesity.   In addition, [name] stated that she has been having dyspnea on exertion  in the last two weeks particularly when she goes to pick the mail which is  about 300 feet. She has to stop halfway and whenever she does house chores  like cleaning, vacuuming, she has to sit down several times and this is  becoming unusual for her.   REVIEW OF SYMPTOMS:  She  denies fever, chills, headaches, change in her  vision. No vomiting, no abdominal pain, diarrhea or constipation, no  dysuria.   PAST MEDICAL HISTORY:  1.  CAD status post CABG.  2.  Hypertension.  3.  Hyperlipidemia.  4.  Diabetes mellitus.  5.  Anemia.  6.  Cardiac catheterization in September 2005 by Dr. Jenne Campus which showed a      patent left internal mammary artery to the left anterior descending and      patent saphenous vein graft to the right coronary artery. However, there      was occlusions involving saphenous vein graft to the diagonal and obtuse      marginal, hence medical therapy was recommended at that time.   CURRENT MEDICATIONS:  1.  Altace 20 mg p.o. daily.  2.  Nu-Iron 150 mg p.o. b.i.d.  3.  Crestor p.o. daily, unsure of the dose.  4.  Glucotrol 10 mg p.o. daily.  5.  Coreg 6.25 mg p.o. b.i.d.  6.  Zetia 10 mg p.o. daily.  7.  Plavix 75 mg p.o. daily.  8.  Prilosec 20 mg p.o. b.i.d.  9.  Lasix 20 mg p.o. daily.  10. Imdur 30 mg p.o. daily.  11. Aspirin 81 mg p.o. daily.  12. Nitroglycerin sublingual p.r.n.   ALLERGIES:  PHENERGAN.  She develops hives.   FAMILY HISTORY:  She is married. Father died of massive heart attack at the  age of 64. Mother died of stroke at the age of 21.   SOCIAL HISTORY:  She never smoked cigarettes. She is independent of  activities of daily living.   PHYSICAL EXAMINATION:  VITAL SIGNS:  Initial vitals, temperature 98.4, blood  pressure 115/40, pulse 72, respiratory rate 24, O2 sat 100% on 2 liters.  GENERAL:  She is __________  distress.  HEENT:  Normocephalic, atraumatic head. Extraocular muscle movement intact.  Pupils are equal and reactive to light. No carotid bruit, no elevated JVD.  LUNGS:  Clear clinically to auscultation. No wheeze, no rhonchi.  CARDIOVASCULAR:  S1, S2, regular, no murmur.  ABDOMEN:  Obese, soft, nontender. Bowel sounds present.  RECTAL:  No mass felt in the rectum, no blood on gloved finger. Stool  sent  for occult blood test.  EXTREMITIES:  No pedal edema, no calf tenderness. Dorsalis pedis pulse is  palpable 2+ bilaterally.  CNS:  No focal neurological deficit.   INITIAL LABORATORY DATA:  White cell 3.8, hemoglobin 11.2, hematocrit 33.3,  platelets 171. Neutrophils 62%, lymphocytes 28%, monocytes 8%. Initial  cardiac markers at the point of care, CK-MB less than 1.0, troponin I less  than 0.05. Sodium 155, potassium 3.5, chloride 104, bicarb 25. Glucose 99,  BUN 15, creatinine 1.3, calcium 8.9, total protein 6.3, albumin 3.7, AST 18,  ALT 17, alkaline phosphatase 62, bilirubin 0.9.   EKG, normal sinus rhythm with a heart rate of 71. EKG is unchanged from old.  No ST elevation. Chest x-ray, cardiomegaly without edema, no acute pulmonary  findings.   ASSESSMENT/PLAN:  [name] is a 65 year old Caucasian female with multiple  cardiovascular risk factors presenting with left shoulder pain, diaphoresis,  palpitation, nausea, shortness of breath right after funeral service for a  nephew. No EKG changes of acute ischemia and cardiac enzyme x1 at the point  of care is negative.   1.  Chest pain, rule out myocardial infarction. Admit to telemetry, serial      cardiac enzymes. Nitropaste 1/2 inch q.4 h, nitroglycerin sublingual      p.r.n., aspirin 81 mg p.o. daily. Continue Coreg 6.25 mg b.i.d., Plavix      75 mg daily, Altace 10 mg p.o. daily. Lovenox 1mg /kg subcut  b.i.d.  2.  History of left ventricular dysfunction with ejection fraction of 30-35.      Continue Altace as above and Coreg.  3.  Emotional stress secondary to grief. Will give Xanax 0.5 mg q.6 p.r.n.      for anxiety. Ambien 10 mg q.h.s. p.r.n. for sleep.  4.  Diabetes mellitus type 2. Check hemoglobin A1c, Glucotrol 10 mg p.o.      daily. Sliding scale insulin with NovoLog.  5.  Dyslipidemia. Check fasting lipid profile. Continue Crestor 10 mg p.o.     daily and Zetia 10 mg p.o. daily.  6.  Hypertension. This is  controlled. Continue Altace and Coreg.  7.  Anemia. Check iron studies on fecal Hemoccult.   ADDENDUM:  1.  The patient's Hemoccult is positive . D/c  Lovenox. The patient will      need GI  evaluation. Start Protonix 40 mg b.i.d. 2.  Obtain x-ray left      shoulder to rule out DJD.  2.  Obesity, weight loss counseling.       MBB/MEDQ  D:  03/24/2005  T:  03/24/2005  Job:  [id number]   cc:   Darlin Priestly, MD  [address], [address]  [place]  [place] [address]  Fax: [phone]   Olene Craven, M.D.  [address]  [address]  [place]  [place] [address]  Fax: [phone]

## 2011-02-11 NOTE — Op Note (Signed)
[place]. [facility]  Patient:    [name], [name]                          MRN: [id number] Proc. Date: 11/07/00 Attending:  Salvatore Decent. [name], M.D. CC:         [name], [name]  [name] [place]   Operative Report  PREOPERATIVE DIAGNOSIS:  Three-vessel coronary disease with unstable angina.  POSTOPERATIVE DIAGNOSIS:  Three-vessel coronary disease with unstable angina.  PROCEDURES:  Median sternotomy, extracorporeal circulation, coronary artery bypass grafting x 5 (left internal mammary artery to the left anterior descending coronary artery, saphenous vein graft to first diagonal, sequential saphenous vein graft to obtuse marginal 1 and 2, saphenous vein graft to posterior descending).  SURGEON:  [name], M.D.  ASSISTANTSSalvatore Decent. [name], M.D., and [name], P.A.  ANESTHESIA:  General.  FINDINGS:  LAD intramyocardial, good-quality target but significant disease distally.  First diagonal small, poor quality.  OM1 small, fair quality.  OM2 good quality.  Posterior descending good quality.  Second diagonal and OM3 too small to graft.  Good-quality conduits.  CLINICAL NOTE:  [name] is a 65 year old white female with type 2 diabetes, hypertension, and hypercholesterolemia.  She had a significant anterior myocardial infarction in December 2001.  She now presents with unstable angina.  She ruled out for myocardial infarction.  She underwent cardiac catheterization, which showed three-vessel disease with an ejection fraction of approximately 50%, which was greatly improved from December.  She underwent PTCA of a complex proximal LAD lesion at the takeoff to the first diagonal but continued to have unstable symptoms and was referred for bypass grafting.  The indications, risks, benefits, and alternative treatments were discussed in detail with the patient.  She understood and accepted the risks and agreed  to proceed.  During her preoperative evaluation, carotid Dopplers revealed a tight left internal carotid artery stenosis.  The patient was asymptomatic, but the lesion was of significant severity to warrant vascular surgery consult from [name], M.D., who recommended concomitant left carotid endarterectomy.  The patient understood the indication for combined surgery and the risks and benefits of the combined procedure as well.  She agreed to proceed.  DESCRIPTION OF PROCEDURE:  [name] was brought to the preoperative holding area on 11/07/00.  Lines were placed to monitor arterial, central venous, and pulmonary arterial pressure.  EKG leads were placed for continuous telemetry. The patient was taken to the operating room, anesthetized, and intubated.  A Foley catheter was placed, IV antibiotics were administered.  The chest, abdomen, and legs were prepped and draped in the usual fashion.  The head was turned to the right, and the left neck was prepped and draped into the sterile field as well.  Dr. Tawanna Cooler Martin then performed a left carotid endarterectomy. The description of the procedure is dictated in a separate operative note.  At the completion of this procedure, the wound was packed with gauze and closed with interrupted vertical mattress sutures for hemostasis during the performance of the bypass grafting.  A median sternotomy was performed, and the left internal mammary artery was harvested in standard fashion.  The patient was given a full dose of heparin prior to dividing the distal end of the mammary artery.  There was good flow through the cut end of the conduit, and the mammary was placed in a papaverine-soaked sponge and placed into the left  pleural space. Simultaneously during the performance of the carotid endarterectomy and takedown of the mammary artery, the greater saphenous vein was harvested from the medial aspect of the right leg.  The greater saphenous vein was  of relatively small size but of good quality, but it was of adequate size for bypass grafting.  The pericardium was opened.  The ascending aorta was inspected and palpated. There was no palpable atherosclerotic disease.  The aorta was cannulated via concentric 2-0 Ethibond pledgeted pursestring sutures.  The dual-stage venous cannula was placed via a pursestring suture in the right atrial appendage. Cardiopulmonary bypass was instituted, and the patient was cooled to 32 degrees Celsius.  The coronary arteries were inspected, and anastomotic sites were chosen.  The conduits were inspected and cut to length.  A foam pad was placed in the pericardium to protect the left phrenic nerve.  A temperature probe was placed in the myocardial septum, and a cardioplegia cannula was placed in the ascending aorta.  The aorta was crossclamped.  The left ventricle was emptied via the aortic root vent.  Cardiac arrest then was achieved with a combination of cold antegrade blood cardioplegia and topical iced saline.  Cardioplegia 600 cc was administered, and the myocardial septal temperature was 9 degrees Celsius.  The following distal anastomoses were performed:  First, a reversed saphenous vein graft was placed end-to-side to the posterior descending branch of the right coronary.  The posterior descending came off of the acute margin and then took a transverse course along the inferior wall. The vessel was grafted just beyond the acute margin of the heart.  It was 1.5 mm in diameter, of good quality at the site of the anastomosis.  The anastomosis was performed with a running 7-0 Prolene suture in an end-to-side fashion.  There was good flow through the graft.  Cardioplegia was administered, and there was good hemostasis.  Next, a reversed saphenous vein graft was placed sequentially to the first ad second obtuse marginal branches of the left circumflex coronary artery.  The third obtuse marginal  branch, which had a 90% stenosis, was less than 1 mm in  diameter, very high in the AV groove, and not graftable.  The side-to-side anastomosis to the first obtuse marginal was performed off the side branch of the vein graft.  OM1 was a 1 mm vessel.  It was of fair quality.  The anastomosis was probed proximally and distally with a 1 mm probe to ensure patency at its completion.  Next, an end-to-side anastomosis was performed to OM2.  This was a 1.5 mm, good-quality target.  This vessel had approximately a 50% proximal stenosis.  The anastomosis again was probed proximally and distally to ensure patency before tying the suture.  There was good flow through this graft.  Cardioplegia was administered down both vein grafts. There was good hemostasis at both OM anastomoses.  Next, a reversed saphenous vein graft was placed end-to-side to the first diagonal branch of the LAD.  The first diagonal had a 70% proximal stenosis and a poststenotic dilatation.  It was a diffusely diseased vessel.  It was 1 mm in diameter and of poor quality.  The anastomosis was performed with a running 8-0 Prolene suture because of the poor quality of the target vessel. A probe was passed proximally and distally to ensure patency.  At the completion of the anastomosis, flow was better than expected.  Cardioplegia was administered, and there was a small leak from the heel, which was  repaired with an 8-0 Prolene suture.  The left internal mammary artery then was brought through a window in the pericardium, and the distal end of the mammary was spatulated.  It was a 2mm, good-quality conduit.  The LAD was identified distally.  There was a long intramyocardial segment of the LAD.  The dissection was carried from the distal end back proximally through the myocardium to a site that was suitable for grafting.  The LAD was a 2 mm vessel at the site of the anastomosis.  It did narrow distally and had significant disease  distally, but a 1.5 mm probe passed easily proximally from the site of the anastomosis.  The LIMA to LAD anastomosis was performed with a running 8-0 Prolene suture in an end-to-side fashion.  At the completion of the anastomosis, the bulldog clamp was removed from the mammary artery.  Immediate and rapid septal rewarming was noted. Lidocaine was administered.  There was good hemostasis.  The mammary pedicle was tacked to the epicardial surface of the heart with 6-0 Prolene sutures. The mammary pedicle was inspected and showed that there was no kinking of the mammary due to the relatively deep intramyocardial location of the target. The aortic crossclamp was removed.  The patient fibrillated and required a single defibrillation with 20 joules.  A partial occlusion clamp was placed on the ascending aorta.  The vein grafts were cut to length.  Of note, because of trimming of a segment of the diagonal vein graft which had a leak due to a torn branch, it was not long enough to reach the ascending aorta, and it was brought off a side branch of the sequential vein graft.  The OM vein graft and PDA vein graft proximal anastomoses were performed to 4.0 mm punch aortotomies.  The ascending aorta was good good quality, thin-walled, with no atherosclerosis at the site of the anastomoses.  At the completion of the final proximal anastomosis, the patient was placed in Trendelenburg position.  Air was allowed to vent as the partial clamp was removed.  The proximal anastomoses were inspected for hemostasis. Bulldog clamps then were placed proximally and distally on the OM vein.  A side branch was excised, and the venotomy was lengthened to match the size of the proximal aspect of the diagonal vein graft, which was anastomosed end-to-side proximally using a running 7-0 Prolene suture.  The distal bulldog clamps were removed first to allow back-bleeding to de-air the anastomosis before removing the  proximal bulldog clamp.  At this point, flow had been restored through all vein grafts.  All proximal and distal anastomoses were inspected for hemostasis.  The patient was being rewarmed during the proximal anastomoses.  Epicardial pacing wires were placed on the right ventricle and right atrium.  The patient was weaned from cardiopulmonary bypass when the core temperature reached 37 degrees Celsius.  The patient was initially on milrinone, but that was discontinued, and the patient was hemodynamically stable, although she did remain tachycardic in the Martin postoperative period. A test dose of protamine was administered and was well-tolerated.  The atrial and aortic cannulae were removed.  There was good hemostasis of both cannulation sites.  The remainder of the protamine was administered without incident.  The chest was irrigated with 1 L of normal saline containing 1 g of vancomycin.  Hemostasis was achieved.  A left pleural and two mediastinal chest tubes were placed through separate subcostal incisions and secured with #1 silk sutures.  The pericardium was reapproximated  with interrupted 3-0 silk sutures.  It came together easily without tension.  The sternum was closed with heavy-gauge interrupted stainless steel wires.  The pectoralis fascia was closed with a running #1 Vicryl suture.  The subcutaneous tissue was closed with a running 2-0 Vicryl suture, and the skin was closed with a 3-0 Vicryl subcuticular suture.  The sutures were removed, and packing was removed from the neck incision.  There was some bleeding from the skin and subcutaneous tissue.  This was controlled with electrocautery.  The anastomosis was inspected and had good hemostasis.  There was minimal capillary bleeding in the neck; however, a #10 flat Jackson-Pratt drain was placed through a separate stab wound incision.  The platysma was closed with a running 2-0 Vicryl suture, the subcutaneous tissue was closed  with a running 3-0 Vicryl suture, and the skin was closed with a 3-0 Vicryl subcuticular suture.  All sponge, needle, and instrument counts were correct at the end of the procedure.  There were no intraoperative complications.  The patient was taken from the operating room to the surgical intensive care unit in stable condition. DD:  11/07/00 TD:  11/08/00  Job: [id number]

## 2011-02-11 NOTE — Cardiovascular Report (Signed)
[place]. [facility]  Patient:    [name], [name]                          MRN: [id number] Proc. Date: 09/23/00 Adm. Date:  [id number] Attending:  Daisey Must CC:         Stacie Acres. Cliffton Asters, M.D.  Cardiac Catheterization Lab   Cardiac Catheterization  PROCEDURES PERFORMED: 1. Left heart catheterization. 2. Coronary angiography. 3. Left ventriculography. 4. Abdominal aortography. 5. PTCA of the proximal left anterior descending artery.  INDICATIONS:  [name] is a 65 year old woman with multiple cardiovascular risk factors.  She has been having intermittent episodes of left shoulder pain, associated with nausea and vomiting over the past two weeks.  Last evening at approximately 7:30 p.m. she felt the onset of shoulder pain which was severe and persisting in nature.  She ultimately presented to the [facility] Emergency Room at approximately 3:15 a.m. with ongoing discomfort and shoulder pain.  EKG was consistent with an acute evolving anterior wall myocardial infarction.  She was started on aspirin, heparin and nitroglycerin; transferred emergently to [facility]. [facility] for emergent catheterization.  CATHETERIZATION PROCEDURAL NOTE:  A 7-French sheath was placed in the right femoral artery.  Standard Judkins 6-French catheters were utilized.  CONTRAST:  Hexabrix.  COMPLICATIONS:  None.  CATHETERIZATION RESULTS:  HEMODYNAMICS: 1. Left ventricular pressure:  140/30. 2. Aortic pressure:  140/82.  There was no aortic valve gradient.  LEFT VENTRICULOGRAM:  There is severe akinesis of the anterior and apical walls, with mild apical dyskinesis.  No mitral regurgitation.  Ejection fraction is calculated at 24%.  ABDOMINAL AORTOGRAM:  Reveals patent abdominal aorta renal arteries and iliac arteries, without atherosclerotic disease.  CORONARY ARTERIOGRAPHY: (Codominant). 1. LEFT MAIN:  Short, but angiographically normal. 2. LEFT ANTERIOR  DESCENDING:  Small-caliber vessel with a 99% stenosis in the    proximal vessel; with a filling defect and TIMI-2 flow.  In the mid LAD is    a 30% stenosis, and the apical LAD has a 95% stenosis.  The apical LAD is    very small in caliber, being less than 1.5 mm in diameter.  There is a    first diagonal branch which is also small in caliber, but normal in length.    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    There is a small second diagonal branch. 3. LEFT CIRCUMFLEX:  A large, codominant vessel.  There is a 30% stenosis in    the proximal circumflex, and a 50% stenosis in the distal circumflex just    proximal to the first posterolateral branch.  The circumflex gives rise to    a normal-sized first marginal branch; which has an 80% stenosis at its    ostium.  There is a large, branching second marginal which has a 50%    stenosis proximally, and a 25% stenosis in the body.  There is a    normal-sized first posterolateral branch, which has a 70% stenosis    proximally.  There is a small second posterolateral branch. 4. RIGHT CORONARY ARTERY:  A relatively small, codominant vessel which ends    as a posterior descending artery.  There is a diffuse 50% stenosis in the    proximal vessel.  IMPRESSION: 1. Severely decreased left ventricular systolic function, secondary to an    acute anteroapical myocardial infarction, with relatively late    presentation.  2. Three-vessel coronary artery disease, as described.  Culprit lesion    being that of the proximal LAD, with 99% stenosis, a filling defect and    TIMI-2 flow.  There was also significant disease in the apical LAD, as    well as in a diagonal branch and an obtuse marginal branch.  PLAN:  Percutaneous intervention of the LAD.  See below.  PTCA PROCEDURAL NOTE:  Following completion of the diagnostic catheterization procedure, we proceeded directly with coronary intervention.  We initially used a  7-French JL 3.5 guiding catheter; however, this tended to direct the guide wire into the circumflex artery.  We therefore used a 7-French JL-3.0 guiding catheter; which did allow passing of the wire into the left anterior descending artery.  We initially tried to advance a BMW wire into the LAD; however, this would not go beyond the mid LAD because of significant angulation and tortuosity of both the proximal and mid LAD.  We therefore used a Choice PT wire, which was successfully passed into the apical LAD.  PTCA of the culprit lesion of the proximal LAD was then performed with a 2.0 x 15 mm CrossSail balloon.  Three sequential inflations were performed; the first at 10, then to 12, then to 14 atm (each inflation for two min duration).  During this time we attempted to pass the balloon into the distal LAD; however, it would not pass beyond the mid LAD (again because of tortuosity and angulation of the vessel).  We therefore were unable to treat the lesion in the apical LAD.   However, as described above, the vessel was a very small-caliber vessel.  Final angiographic images revealed patency of the proximal LAD, with 20% residual stenosis and TIMI-3 flow.  COMPLICATIONS:  None.  PERCUTANEOUS RESULTS:  Successful PTCA of the proximal LAD.  Reduction of 99% stenosis with filling defect and TIMI-2 flow to 20% residual with TIMI-3 flow.  PLAN:  Integrilin to continue for 48 hr.  Aspirin will be continued indefinitely.  Will give consideration to short-term use of Coumadin because of the significant anteroapical akinesis. DD:  09/23/00 TD:  09/23/00 Job: [id number]

## 2011-02-11 NOTE — Cardiovascular Report (Signed)
NAME:  [name], [name]                           ACCOUNT NO.:  [id number]   MEDICAL RECORD NO.:  [id number]                   PATIENT TYPE:  INP   LOCATION:  2040                                 FACILITY:  [facility]   PHYSICIAN:  Nicki Guadalajara, M.D.                  DATE OF BIRTH:  02-07-1946   DATE OF PROCEDURE:  06/03/2003  DATE OF DISCHARGE:                              CARDIAC CATHETERIZATION   INDICATIONS:  Ms. [name] is a 65 year old white female with known  coronary artery disease.  She is status post initial remote anterolateral MI  treated with PTCA December 2001 initially by Carole Binning, M.D. [facility].  She required repeat intervention in February 2002 for restenosis and shortly  thereafter was referred for bypass surgery which was done by Viviann Spare C.  Dorris Fetch, M.D. in February 2002 where she had a LIMA placed to her LAD, a  Y graft placed with one portion of the graft going to the diagonal vessel  and the second limb of the Y being a sequential graft supplying the OM 1 and  OM 2 vessel.  She also had a vein graft to the PDA system.  The patient was  ultimately referred to our group in February 2004 when she developed  recurrent symptoms of chest pain suggesting unstable angina.  Catheterization was done by me on November 04, 2002.  Please refer to that  catheterization report.  The following day she underwent coronary  intervention to the vein graft to the diagonal vessel just after the Y  takeoff of the other limb.  She underwent cutting balloon atherotomy with  PTCA.  Subsequently, she felt significantly improved.  She also had other  sources of potential ischemia including a second diagonal vessel, as well as  her OM 1 vessel since the sequential graft supplying the OM 1 and OM 2 did  not fill the OM 1 vessel antegrade but there were collaterals to this  region.  Discussions were made concerning EECP treatment.  The patient has  never had this yet.  She had done well,  however, until this weekend when on  June 01, 2003 she was admitted with increasing shortness of breath,  weakness, and chest pain with improvement.  It was felt her CHF potentially  was ischemia mediated.  She is now referred for right and left heart  catheterization.   PROCEDURE:  Right and left heart catheterization was done via the right  femoral artery, right femoral vein.  Her right femoral artery was punctured  anteriorly and a 6-French sheath was inserted.  Initially, catheterization  of the coronary arteries was performed.  A __________ was performed  utilizing 6-French FL4 NFR4 catheters which were used for the native  coronary arteries.  The right catheter was also used for selective  angiography into the vein grafts as well as the left internal mammary  artery.  Pigtail catheter was then inserted and advanced to the pulmonary  artery.  Right femoral vein was punctured and a Swan-Ganz catheter was  inserted under hemodynamic monitoring to the pulmonary artery.  Simultaneous  pulmonary artery and central aortic pressures were recorded.  The pigtail  catheter was then passed into the left ventricle and simultaneous pulmonary  capillary wedge pressure and left ventricular pressures were recorded.  Cardiac outputs were obtained by the thermodilution and assumed Fick  methods.  O2 saturations were obtained in the LV and pulmonary artery.  Biplane cineangiographic left ventriculography was then performed.  An LV to  AO pullback was recorded as was right heart pullback.  Hemostasis was  obtained by direct manual pressure.  The patient tolerated procedure well  and returned to her room in satisfactory condition.   HEMODYNAMIC DATA:  1. Right atrial pressure A-wave 15, V-wave 13, mean 10.  2. Right ventricular pressure 42/10.  3. Pulmonary artery pressure 42/18, mean 28.  4. Pulmonary capillary wedge pressure mean 16-18.  5. Central aortic pressure 122/62, mean 86.  6. Left  ventricular pressure 122/16.  7. O2 saturation in the LV was 96% and in the pulmonary artery was 65%.  8. Cardiac output by the thermodilution method was 4.8, by the Fick method     was 3.7 L/minute.  9. Cardiac index was 2.5 and 1.9 L/minute/sq m.   ANGIOGRAPHIC DATA:  Left main coronary artery was angiographically normal,  bifurcated into an LAD and left circumflex system.   The LAD had diffuse disease with 70% stenoses in its proximal segment.  First diagonal vessel was small caliber, had 95% diffuse proximal stenosis.  The second diagonal vessel had diffuse 90-95% stenoses in a small vessel.  The LAD after the second diagonal vessel had diffuse 80-90% stenoses  proximal to the LIMA anastomosis.  A flush and fill phenomenon was seen  due to the competitive flow to the mid distal LAD via the LIMA graft.   The Y vein graft arose from the aorta and then shortly thereafter bifurcated  into its two limbs.  One limb which was straight supplied the diagonal  vessel.  This was just after the limb giving rise to the sequential OM 1, OM  2 graft.  There was no significant restenosis at the site of prior  intervention in the diagonal component of this wide limb.  There was  residual narrowing of 20 to less than 30% at the site of prior cutting  balloon atherotomy as well as cutting balloon atherotomy/PTCA.  The Y limb  which supplied sequentially the OM 1 and OM 2 vessel did not have any  significant stenoses.  However, as was noted previously, the OM 1 marginal  was not visualized from this graft suggesting occlusion at the anastomosis  and there were collaterals supplying this OM 1 vessel arising from the OM 2.  There was no significant narrowing distal to the anastomosis in the OM 2  vessel.  However, just proximal to the anastomosis there was 50-60%  narrowing and then 50-70% narrowing at the ostium of the OM 1 takeoff from  the AV groove circumflex.  The right coronary artery was small  caliber vessel.  There was diffuse 60%  stenoses in the mid portion proximal to the graft supplying the PDA system.  The vein graft supplying the PDA was widely patent.  There was a 50%  narrowing just above and below the anastomosis into the small distal RCA.   The  left internal mammary artery was widely patent and anastomosed into the  mid LAD after diffusely diseased 80-90% stenosis.  The distal LAD was small  caliber.  As noted above, the vein graft supplying the circumflex vessel did  not have any significant restenosis with residual narrowing of 20 to less  than 30% at the site of prior cutting balloon atherotomy at the ostial  proximal portion in the limb supplying the diagonal vessel just after the  graft bifurcation into its two limbs.   Biplane cineangiographic left ventriculography revealed preserved global  contractility with an EF of 50-55%.  There was focal mid anterolateral  hypocontractility.   IMPRESSION:  1. Preserved global left ventricular contractility with an ejection fraction     of 50-55% with focal mid anterolateral hypocontractility.  2. Significant native coronary obstructive disease with diffuse 60-70%     proximal left anterior descending stenosis, 95% stenosis in the first     diagonal vessel of the left anterior descending, diffuse 90% stenosis in     a small second diagonal branch of the left anterior descending with     diffuse 80-90% stenosis in the mid left anterior descending after the     second diagonal takeoff proximal to the left internal mammary artery     insertion.  3. Diffuse 95% obtuse marginal 1 stenosis with total occlusion of the obtuse     marginal 1 vessel at the anastomosis into the sequential vein graft, 50-     70% stenosis in the ostial portion of the obtuse marginal 2 graft with     60% narrowing just proximal to the obtuse marginal 2 graft anastomosis     without distal disease beyond the graft anastomosis.  4. Diffuse 60-70%  stenosis in a small right coronary artery proximal to the     graft insertion.  5. Patent vein graft to the posterior descending artery with 40-50%     narrowing proximal to and just distal to the anastomosis in a small     distal right coronary artery system.  6. Patent left internal mammary artery to the left anterior descending.  7. Patent Y graft with one limb supplying the diagonal vessel without     restenosis at the site of prior cutting balloon atherotomy/PTCA and the     second limb being a sequential graft supplying the obtuse marginal     1/obtuse marginal 2 vessel with old occlusion of the obtuse marginal 1     vessel and a patent distal limb between the obtuse marginal 1 and obtuse     marginal 2 vessel with the vein graft without significant narrowing     beyond the obtuse marginal 2 anastomosis.   RECOMMENDATIONS:  Medical therapy.  Further discussion will be made  concerning ECP treatment in light of potential multiple sources of ischemia with relatively intact grafts.                                               Nicki Guadalajara, M.D.    TK/MEDQ  D:  06/03/2003  T:  06/03/2003  Job:  [id number]   cc:   Orville Govern   Cath Lab   Ranae Pila, M.D.  [place], [place]

## 2011-02-11 NOTE — Discharge Summary (Signed)
NAME:  [name], [name]                           ACCOUNT NO.:  [id number]   MEDICAL RECORD NO.:  [id number]                   PATIENT TYPE:  INP   LOCATION:  3736                                 FACILITY:  [facility]   PHYSICIAN:  Nicki Guadalajara, M.D.                  DATE OF BIRTH:  1946-03-30   DATE OF ADMISSION:  06/04/2004  DATE OF DISCHARGE:  06/08/2004                                 DISCHARGE SUMMARY   DISCHARGE DIAGNOSES:  1.  Chest pain, resolved, ruled out for myocardial infarction by serial      enzymes and negative for EKG changes, status post catheterization, this      admission.  Recommendations:  Medical therapy.  2.  Left ventricular dysfunction with ejection fraction of 30% to 35%.  3.  Known coronary artery disease, status post coronary artery bypass      grafting.  4.  Hypertension.  5.  Hyperlipidemia.  6.  Diabetes mellitus.  7.  Anemia, needs outpatient workup with gastroenterology, rule out iron      deficiency anemia and possible current gastrointestinal bleed.   HISTORY OF PRESENT ILLNESS AND HOSPITAL COURSE:  This is a 65 year old  Caucasian female who woke up on the morning prior to her presentation to the  emergency room with symptoms of indigestion and nausea.  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  She called her primary physician and  was referred to the emergency room.  Upon presentation to the ER, we saw the  patient and she still had some chest tightness and some shortness of breath,  and knowing her prior history of coronary artery disease, decision was made  to admit her to telemetry, to rule out MI and subject her to cath for  definitive diagnosis of progression of coronary disease.   HOSPITAL PROCEDURES:  The patient underwent catheterization on June 07, 2004 by Dr. Darlin Priestly.  Cath showed a patent left internal mammary  artery to the left anterior descending and patent saphenous vein graft to  the right coronary artery, but saphenous vein graft to the diagonal and  obtuse marginal had occlusions.  The venous graft to the diagonal had a 40%  to 50% lesion and saphenous vein graft to the obtuse marginal had 30%  stenosis, recommendations for medical therapy.   HOSPITAL COURSE:  During this admission, the patient's hemoglobin was found  to be low and it was thought that her chest discomfort could be related to  the underlying GI problems and we recommended she see her gastroenterology,  Dr. Everardo All. Madilyn Fireman, to rule out any PUD or other lesions as a possible source  of anemia and her chest discomfort.  Meanwhile, we recommended the patient  increase the dose of her Prilosec to b.i.d. before she can be seen by Dr.  Madilyn Fireman and we started her on iron supplements while in the hospital.   HOSPITAL LABORATORIES:  BMP showed potassium 3.7, sodium 138, chloride 106,  CO2 24, glucose 166, BUN 10, creatinine 0.9.   CBC showed red blood cell 3.2, hemoglobin 10.1, hematocrit 28.5, platelet  count 201,000.  TSH was normal.  Cardiac panel revealed negative results x3.  Magnesium was 2.1.  Lipase was 20 and reticulocyte count was 75.7 with low  RBC of 3.29 on the day of discharge.   EKG did not show any acute ST-T wave changes and was sinus rhythm.   The patient is being discharged home in stable condition.   DISCHARGE MEDICATIONS:  1.  Altace 20 mg daily.  2.  Nu-Iron 150 mg b.i.d.  3.  Lipitor 20 mg daily.  4.  Glucotrol 10 mg daily.  5.  Coreg 6.25 mg b.i.d.  6.  Zetia 10 mg daily.  7.  Plavix 75 mg daily.  8.  Prilosec 20 mg b.i.d.  9.  Lasix 20 mg daily.  10. Imdur 30 mg daily.  11. Aspirin 81 mg daily.  12. Nitroglycerin 0.4 mg under the tongue as needed.   INSTRUCTIONS:  No driving.  No lifting greater than 5 pounds.  No strenuous  activity for 3 days  after cath.   DISCHARGE DIET:  Low-fat, low-cholesterol diet.   WOUND CARE:  She was allowed to take a shower.   FOLLOWUP:  Instructed to follow up for GI evaluation with Dr. Madilyn Fireman and Dr.  Tresa Endo will see the patient in our office on June 23, 2004.      Raymon Mutton, P.A.                    Nicki Guadalajara, M.D.    MK/MEDQ  D:  06/08/2004  T:  06/08/2004  Job:  [id number]   cc:   Everardo All. Madilyn Fireman, M.D.  [address], [address]  [place]  [place] [address]  Fax: [phone]

## 2011-02-11 NOTE — Discharge Summary (Signed)
[place]  Patient:    [name], [name]                          MRN: [id number] Adm. Date:  [id number] Disc. Date: 11/12/00 Attending:  Charlett Lango Dictator:   Lissa Hoard, P.A. CC:         Dr. Tomie China, Langston Masker. Dorris Fetch, M.D.  Larina Earthly, M.D.   Discharge Summary  DATE OF BIRTH:  May 17, 1946  ADMISSION DIAGNOSIS:  Chest pain.  SECONDARY DIAGNOSES: 1. Coronary artery disease. 2. Hypertension. 3. Congestive heart failure. 4. Gastroesophageal reflux disease.  DISCHARGE DIAGNOSES: 1. Coronary artery disease. 2. Status post carotid endarterectomy. 3. Status post coronary artery bypass graft.  HOSPITAL PROCEDURES: 1. Cardiac catheterization. 2. Carotid duplex. 3. Upper extremity Doppler examination. 4. Lower extremity Doppler examination. 5. Coronary artery bypass graft x 5. 6. Left carotid endarterectomy.  HOSPITAL COURSE:  [name] was admitted to [facility] on November 03, 2000, secondary to chest pain.  She underwent cardiac work-up prior which included the cardiac catheterization which revealed significant coronary artery disease.  Because of this Dr. Dorris Fetch was consulted for surgical intervention.  As part of her preoperative cardiac work-up, she also underwent bilateral carotid duplex which showed a left internal carotid artery stenosis of greater than 80%.  Because of this, Dr. Arbie Cookey was consulted for a possible carotid endarterectomy to be done the same day as her coronary artery bypass graft.  She subsequently underwent coronary artery bypass graft x 5 with a left internal mammary artery and anastomosis of the left anterior descending artery, a sequential saphenous vein graft to the obtuse marginal #1 and obtuse marginal #2 arteries, a saphenous vein graft to the posterior descending artery and a saphenous vein graft to the first diagonal artery. She also underwent a left carotid  endarterectomy.  This was done on November 07, 2000.  This was done by Dr. Dorris Fetch and Dr. Arbie Cookey.  No complication was noted during the procedure.  Postoperatively, the patients hospital course was complicated by nausea. This was felt to be partially residual effects from anesthesia.  The nausea and vomiting was self-limited and resolved without any intervention.  The patient was subsequently discharged home in stable and satisfactory condition on November 12, 2000.  DISCHARGE MEDICATIONS: 1. Ultram 50 mg 1-2 tablets q.4-6h. as needed for pain. 2. Aspirin 325 mg one tablet daily. 3. Lopressor 50 mg one-half tablet q.12h. 4. Glucophage 500 mg one tablet twice daily. 5. Prevacid 30 mg one tablet daily. 6. Lipitor 20 mg one tablet daily. 7. Lasix 20 mg one tablet daily. 8. Potassium chloride 20 mEq one tablet daily.  DISCHARGE ACTIVITY:  The patient is told to avoid driving or strenuous activity, lifting objects over 10 pounds.  DISCHARGE DIET:  An 1800-calorie ADA diet.  WOUND CARE:  The patient was told to clean her incisions with soap and water and that she may shower.  DISPOSITION:  Home.  FOLLOWUP:  The patient was told to call her cardiologist, Dr. Tomie China for an appointment in two weeks.  She was also instructed to follow up with Dr. Dorris Fetch in three weeks at his office.  She was told to go to [facility] one hour before her appointment with Dr. Dorris Fetch for a chest x-ray.  She was told that the CVTS will notify her and let her know of the exact time and date of her appointment  with Dr. Dorris Fetch. DD:  11/12/00 TD:  11/12/00 Job: [id number]

## 2011-02-11 NOTE — Discharge Summary (Signed)
[name], [name]                 ACCOUNT NO.:  [id number]   MEDICAL RECORD NO.:  [id number]          PATIENT TYPE:  INP   LOCATION:  2015                         FACILITY:  [facility]   PHYSICIAN:  Michaelyn Barter, M.D. DATE OF BIRTH:  Mar 24, 1946   DATE OF ADMISSION:  08/13/2005  DATE OF DISCHARGE:  08/16/2005                                 DISCHARGE SUMMARY   FINAL DIAGNOSIS AT TIME OF DISCHARGE:  Chest pain.   SECONDARY DIAGNOSES:  1.  Hypertension.  2.  Diabetes mellitus.  3.  Hyperlipidemia.   PRIMARY CARE PHYSICIAN:  Olene Craven, M.D.   HISTORY OF PRESENT ILLNESS:  [name] is a 65 year old female who arrived  to the hospital with the chief complaint of chest pain.  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  When she initially arrived the pain was a 10 out of  10 in intensity.  She stated that she had some shortness of breath and some  slight diaphoresis and stated that the pain only lasted for a few seconds  and resolved and has not recurred since that initial onset.  She also stated  that there was some radiation down the arm but denied any other associated  symptoms.   PAST MEDICAL HISTORY:  For the past medical history, please look at the  admitting history and physical dictated by Dr. Freeman Caldron.   HOSPITAL COURSE:  PROBLEM #1:  CHEST PAIN:  Following the patient's  admission into the hospital, she had a chest x-ray completed on August 13, 2005.  It was interpreted as cardiomegaly without failure.  She was admitted  to the telemetry floor and her cardiac enzymes were ordered in addition to  an electrocardiogram.  The patient's cardiac enzymes were the following:  Her troponin-I's were less than 0.01, 0.02, and 0.02.  Therefore, the  patient ruled out for cardiac event.  Likewise, the patient had an  electrocardiogram completed which revealed normal sinus rhythm, no obvious Q  waves or ST segment abnormalities were discovered on the electrocardiogram.  Over the course of the patient's hospitalization she never complained of any  chest pain and in fact, stated that she had been ambulating around the  nurses station without any symptoms at all.  By the patient's second day of  admission she began to state that she was ready to go home.  The patient was  placed on telemetry and over the course of her hospitalization there were no  abnormalities noted on the telemetry monitor.  With further discussion, the  patient indicated that the chest pain that she had had prior to her  admission into the hospital was similar to the chest pains that she had  during a previous admission back in July.  During that time the patient  underwent a nuclear medicine Myoview cardio perfusion stress test and the  final results of that stress test back in March 27, 2005, only four months  ago, was that there was no definite inducible or  reversible ischemia  pharmacologically.  There was mild septal hypokinesis and ejection fraction  was noted to be 55%.  I received several phone calls from the nurses during  the course of this hospitalization of the patient indicating that she was  ready to go home.  Likewise, today, August 16, 2005, the patient's husband  has arrived and both of them state that she is ready to go home.  The  patient does have a history of coronary artery disease, however, past  medical records indicate that medical management has been recommended for  the patient in the past.  I consulted cardiology, however the patient left  the hospital prior to being seen.   PROBLEM #2:  HISTORY OF HYPERTENSION:  The patient's blood pressure has been  stable over the course of her hospitalization.   PROBLEM #3:  HISTORY OF DIABETES MELLITUS:  The patient's Accu-Chek's have  been monitored on a daily basis and they have remained controlled over the  course of her hospitalization.    PROBLEM #4:  HYPERLIPIDEMIA:  This has remained stable over the course of  her hospitalization.   CONDITION ON DISCHARGE:  Improved.  The patient, again, has not complained  of any chest pain over the course of her hospitalization and again stated  that the chest pain that she had prior to this hospitalization lasted for  only a few seconds and was almost identical to that she had experienced back  in July of 2006 at which time she underwent a stress test which was  negative.  Therefore, after discussion with the patient and the patient's  husband, the decision has been made to discharge the patient from the  hospital.  The patient has, however, been instructed to return to the  hospital if there is any further return of chest pain.  She will be  discharged home on the following medications:   DISCHARGE MEDICATIONS:  1.  Aspirin 325 mg p.o. daily.  2.  Coreg 6.25 mg p.o. b.i.d.  3.  Plavix 75 mg p.o. daily.  4.  Zetia 10 mg daily.  5.  Glucotrol 10 mg p.o. daily.  6.  Protonix 40 mg daily.  7.  Altace 20 mg daily.  8.  Zocor 40 mg daily.   Initially, I was inclined to consult cardiology regarding this situation.  However; again, after having multiple lengthy discussion's with the patient  regarding her chest pain and after the patient's cardiac enzymes proved to  be negative X3 and in addition, after reviewing the patient's  electrocardiogram, which was essentially negative for any acute events, the  decision was made to discharge the patient home, but again, the patient has  been instructed to return to the hospital if she has a repeat episode of  chest pain, at which time cardiology will be consulted.      Michaelyn Barter, M.D.  Electronically Signed     OR/MEDQ  D:  08/16/2005  T:  08/16/2005  Job:  [id number]   cc:   Olene Craven, M.D.  Fax: [phone]

## 2011-02-11 NOTE — H&P (Signed)
NAME:  [name], [name]                 ACCOUNT NO.:  [id number]   MEDICAL RECORD NO.:  [id number]          PATIENT TYPE:  INP   LOCATION:  2015                         FACILITY:  [facility]   PHYSICIAN:  Toby L. Fugate, D.O.   DATE OF BIRTH:  11/01/1945   DATE OF ADMISSION:  08/13/2005  DATE OF DISCHARGE:                                HISTORY & PHYSICAL   PRIMARY CARE PHYSICIAN:  Dr. Barbee Shropshire.   CARDIOLOGIST:  Dr. Jenne Campus.\   CHIEF COMPLAINT:  Chest pain.   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  This stabbing chest pain was 10/10 in intensity.  The patient was associated with shortness of breath and diaphoresis.  There  was radiation into the left arm.  The patient does have a history of  coronary artery disease and is status post CABG.  Her other health problems  include hypertension, hyperlipidemia and diabetes.  Currently, the patient  is chest pain free and comfortable.   PAST MEDICAL HISTORY/PAST SURGICAL HISTORY:  1.  Coronary artery disease status post CABG.  The patient did have a      cardiac catheterization in September 2005 which showed a patent      LIMA/LAD, patent SVG-RCA.  There were occlusions of the SVG-diagonal and      SVG-marginal.  At that time, cardiology recommended medical management.      On March 27, 2005 the patient had a pharmacological stress test which      showed no inducible ischemia, mild hypokinesia, and an EF of 55%.  2.  Hypertension.  3.  Hyperlipidemia.  4.  Diabetes.  5.  Mania.   MEDICATIONS:  1.  Aspirin 81 mg, 1 p.o. daily.  2.  Coreg 6.25 mg p.o. b.i.d.  3.  Plavix 75 mg p.o. daily.  4.  Zetia 10 mg p.o. daily.  5.  Lasix 20 mg p.o. daily.  6.  Glucotrol 10 mg p.o. daily.  7.  Protonix 40 mg p.o. daily.  8.  Altace 20 mg p.o. daily.  9.  Zocor 40 mg p.o. daily.   ALLERGIES:  Phenergan causing  hives.   SOCIAL HISTORY:  The patient denies alcohol, tobacco and IV drug abuse.   FAMILY HISTORY:  Significant for coronary artery disease and CVA.   REVIEW OF SYSTEMS:  A complete 12 point review of systems was obtained.  The  review was negative except for that stated in the HPI.   PHYSICAL EXAMINATION:  Temperature 98, blood pressure 110/60, pulse 72,  respiratory rate 20.   HEENT:  Pupils were equally round and reactive to light.  Extraocular  muscles were intact.  There was no scleral icterus.  Oropharynx clear,  moist, no erythema or thrush.  NECK:  No JVD, no carotid bruits, no adenopathy.  HEART:  Regular rate and rhythm.  No murmurs, rubs or gallops.  LUNGS:  Clear to auscultation bilaterally.  No wheezes, rales or rhonchi.  ABDOMEN:  Positive bowel sounds, nontender, nondistended.  EXTREMITIES:  No edema, clubbing or cyanosis.  NEUROLOGIC:  Cranial nerves II-XII are intact.  There were no focal  deficits.  DTRs are 2/4 in all extremities.  Strength is 5/5 in all  extremities.   LABORATORY DATA:  Sodium 139, potassium 3.8, chloride 108, BUN 16, glucose  191.  Creatinine 1.3.  Troponin negative.  EKG showed normal sinus rhythm.   Chest x-ray showed cardiomegaly.   ASSESSMENT/PLAN:  1.  Chest pain.  Given the patient's significant past cardiac history, I      will admit the patient to a telemetry bed.  I will provide oxygen,      aspirin, beta blocker and sublingual nitroglycerin as needed.  I will      continue to follow the patient's cardiac enzymes.  I will repeta an EKG      in the morning.  If necessary, I will consult cardiology.  2.  Diabetes.  I will continue the patient's oral medications.  In addition,      I will cover the patient with sliding scale insulin.  I will check Accu-      checks q.a.c. and q.h.s.  3.  Hypertension.  I will continue the patient on her outpatient regimen.      Blood pressure appears to be controlled.  4.  Hyperlipoidemia.  I will  continue the patient on Zocor.  5.  The patient is a full code.      Toby L. Fugate, D.O.  Electronically Signed     TLF/MEDQ  D:  08/14/2005  T:  08/14/2005  Job:  [id number]

## 2011-02-11 NOTE — Cardiovascular Report (Signed)
NAME:  [name], [name]                           ACCOUNT NO.:  [id number]   MEDICAL RECORD NO.:  [id number]                   PATIENT TYPE:  INP   LOCATION:  2040                                 FACILITY:  [facility]   PHYSICIAN:  Nicki Guadalajara, M.D.                  DATE OF BIRTH:  05-04-1946   DATE OF PROCEDURE:  11/04/2002  DATE OF DISCHARGE:                              CARDIAC CATHETERIZATION   INDICATION:  The patient is a 65 year old white female who suffered an  anterior wall myocardial infarction in December 2001, at which time she  underwent intervention of her proximal LAD.  At that time, she also had  diffuse disease in other blood vessels.  Apparently, she underwent repeat  intervention on November 03, 2001 by Dr. Charlies Constable of her LAD, which was  done secondary to restenosis.  She also had 70% stenosis in the first  diagonal vessel and multiple 90% stenoses in a small second diagonal branch  with 80% distal disease, 90% stenosis in the first marginal branch of the  circumflex, with 80% stenosis in the posterolateral branch, 50% stenosis in  the nondominant right coronary artery.  There was apical wall akinesis.  Apparently, the patient ultimately was referred for CVG revascularization  surgery, which was done by Dr. Viviann Spare C. Dorris Fetch on November 07, 2000 x5  with a LIMA to the LAD, a saphenous vein graft to the first diagonal vessel,  a sequential vein graft to the OM-1 and OM-2, and a vein graft to the PDA.  Apparently, the patient had done relatively well initially, but has  experienced recurrent episodes of intermittent left arm discomfort and chest  pain similar to prior bypass surgery.  She was admitted to [facility] on November 03, 2002.  She is now referred for cardiac  catheterization.   DESCRIPTION OF PROCEDURE:  After premedication with Valium intravenously,  the patient was prepped and draped in the usual fashion.  Her right femoral  artery was  punctured anteriorly and a 6-French sheath was inserted.  Diagnostic catheterizations of the native coronary arteries were performed  using 6-French left and right Judkins 4 catheters.  The right catheter was  used for selective angiography into the vein graft, which apparently was a Y  graft, with one limb supplying the diagonal vessel and the other portion of  the graft supplying the OM vessel.   A no-torque right was necessary for selective engagement into the vein graft  supplying the right coronary artery after an attempt at right bypass graft  was unsuccessful.  The right catheter was also used for selective  angiography in the left internal mammary artery.  A pigtail catheter was  used for biplane cineventriculography as well as distal aortography.  Hemostasis was obtained by direct manual pressure.   HEMODYNAMIC DATA:  Central aortic pressure was 140/67, mean 97.  Left  ventricular  pressure was 140/15, pulse A wave 22.   ANGIOGRAPHIC DATA:  Left main coronary artery bifurcated into the LAD and  left circumflex system.   The LAD was calcified proximally and there was diffuse narrowing of 60% at  the site of initial MI before the first diagonal vessel.  The first diagonal  vessel was diffusely diseased and 95% stenosis and a flush-and-fill factor  was seen beyond this segment.  The second diagonal vessel was a very small-  caliber vessel that had diffuse 80-90% stenosis.  After the septal  perforator, there was a flush-and-fill phenomenon seen in the mid LAD  secondary to competitive LIMA flow.   The left circumflex vessel had irregularity proximally.  The OM-1 vessel was  a very small-caliber, diffusely diseased vessel that appeared to be occluded  in its mid-segment.  The OM-2 vessel had 50% proximal narrowing and  competitive filling was seen in its mid-segment after an inferior branch.  The A-V groove circumflex was relatively normal, although there was mild 20%  narrowing  proximally after the OM-2 takeoff.  There was 30% narrowing in the  PDA branch.   The right coronary artery was a small-caliber, previously-known-to-be  nondominant vessel that had 70-80% diffuse narrowing proximally and was  small caliber.  Competitive filling was seen in the midportion due to  competitive flow.   The LIMA to the LAD was widely patent and anastomosed to the mid LAD.  Antegrade to the LIMA anastomosis, there was diffuse 80-90% stenosis leading  up to that second diagonal vessel.  Beyond the graft anastomosis, the distal  LAD was small caliber and free of significant disease.   The vein graft supplying the diagonal marginal vessels apparently was a Y  graft.  The proximal portion of the Y graft was normal.  This vessel then  gave rise to the marginal graft.  Apparently, this supposedly was a  sequential graft supplying the small OM-1 vessel and OM-2.  The OM-1 limb  was occluded, the vessel anastomosing to the OM-2.  In the Y graft beyond  the vein graft to the marginal takeoff, there was narrowing of 70-80%  focally.  The remaining portion of this diagonal vessel beyond the graft  anastomosis was small caliber with mild irregularities but free of high-  grade stenoses.   The saphenous vein graft supplying the distal right coronary artery was  angiographically normal.   Biplane cineventriculography revealed focal anterolateral hypocontractility.  Ejection fraction was in the 50% range.  There was also distal apicoseptal  hypocontractility in the LAO projection.   Distal aortography demonstrated no significant aortoiliac disease.   IMPRESSION:  1. Mild left ventricular dysfunction with anterolateral distal septal     hypocontractility.  2. Severe native coronary obstructive disease with calcified left anterior     descending system with diffuse 60-70% narrowing at site of prior    percutaneous transluminal coronary angioplasty, diffuse 95% proximal     first  diagonal stenosis, diffuse 90% second diagonal stenosis and a small     second diagonal vessel with diffuse 80-90% mid left anterior descending     stenoses proximal to the left internal mammary artery anastomosis; total     occlusion of the obtuse marginal vessel of the circumflex with 50%     proximal narrowing in the obtuse marginal 2 vessel, 20% atrioventricular     groove stenosis and 30% distal circumflex stenosis.  3. Nondominant right coronary artery with diffuse 70-80% proximal narrowing  with competitive filling in the mid-segment due to competitive vein flow.  4. Patent left internal mammary artery to the left anterior descending     anastomosing to a small mid-distal left anterior descending system, free     of significant disease beyond the anastomosis.  5. Y graft with the main limb supplying the diagonal vessel, but with     narrowing of 70% beyond the vein portion supplying the circumflex     territory and with a patent vein to the obtuse marginal 2 vessel but with     apparent occlusion of the obtuse marginal 1 limb in this apparent     sequential graft.  6. Patent vein graft to the right coronary artery.    DISCUSSION:  Cineangiograms will be reviewed with colleagues.  The patient  is diabetic.  She will be hydrated vigorously.  Consideration will be made  for possible cutting balloon arthrotomy/PTCA of the body of the graft  supplying the diagonal vessel just after the Y limb, which would not be  suitable for intervention.  The patient also has diffuse disease and other  potential sources of ischemia.  She would be considered also for ECP  therapy.                                               Nicki Guadalajara, M.D.    TK/MEDQ  D:  11/04/2002  T:  11/04/2002  Job:  [id number]   cc:   [facility]   [place], [place] [facility]

## 2011-02-11 NOTE — Discharge Summary (Signed)
NAME:  [name], [name]                           ACCOUNT NO.:  [id number]   MEDICAL RECORD NO.:  [id number]                   PATIENT TYPE:  INP   LOCATION:  3707                                 FACILITY:  [facility]   PHYSICIAN:  Nicki Guadalajara, M.D.                  DATE OF BIRTH:  Aug 13, 1946   DATE OF ADMISSION:  12/07/2003  DATE OF DISCHARGE:  12/08/2003                                 DISCHARGE SUMMARY   ADMISSION DIAGNOSES:  1. Chest pain.  2. Type 2 diabetes.  3. Hypertension.  4. Hypercholesterolemia.  5. Gastroesophageal reflux disease.  6. Obesity.  7. Status post coronary artery bypass grafting.  8. Status post cholecystectomy and hysterectomy.   DISCHARGE DIAGNOSES:  1. Chest pain.  2. Type 2 diabetes.  3. Hypertension.  4. Hypercholesterolemia.  5. Gastroesophageal reflux disease.  6. Obesity.  7. Status post coronary artery bypass grafting.  8. Status post cholecystectomy and hysterectomy.   PROCEDURE:  None.   BRIEF HISTORY:  The patient is a 65 year old patient who presented to the  emergency room with complaints of chest pain. She has previously undergone  CABG by Dr. Dorris Fetch. She was admitted for 24-hour observation after  being seen by Dr. Daphene Jaeger. Her EKG is not available on the chart, but it  was reviewed by Dr. Tresa Endo, and it was low voltage with possible anterior  infarct but no significant ST-T wave changes. She was admitted. She had no  further chest discomfort. She was placed on observation status. Her CKs and  troponins were negative. She was seen the following a.m. by Dr. Tresa Endo, and  it was his opinion that she could go home. We plan to obtain a stress  exercise Cardiolite this week and followup with Dr. Tresa Endo after that.   MEDICATIONS ON ADMISSION:  1. Plavix 75 mg q.a.m.  2. Aspirin 81 mg q.d.  3. Altace 10 mg q.d.  4. Lasix 20 mg q.d.  5. Lipitor 20 mg q.d.  6. Glucotrol 10 mg q.d.   DISCHARGE MEDICATIONS:  1. Plavix 75 mg q.a.m.  2.  Aspirin 81 mg q.d.  3. Altace 10 mg q.d.  4. Lasix 20 mg q.d.  5. Lipitor 20 mg q.d.  6. Glucotrol 10 mg q.d.  7. Nitroglycerin 1/150 SL p.r.n.  8. Imdur 30 mg q.d. until she has completed her studies.  9. She was also placed on Nexium. Her insurance would not pay for the     medication. We will give her a prescription for Protonix 40 mg b.i.d.     until we have definitive evidence whether this is reflux or cardiac in     nature.   DISCHARGE ACTIVITIES:  Light to moderate as tolerated. No lifting over 10  pounds. No driving. No strenuous activity. She will return as soon as  studies from the Cardiolite is completed to see Dr. Tresa Endo.  Eber Hong, P.A.                 Nicki Guadalajara, M.D.    WDJ/MEDQ  D:  12/08/2003  T:  12/09/2003  Job:  [id number]   cc:   Olene Craven, M.D.  [address]  [address]  [place]  [place] [address]  Fax: [phone]

## 2011-02-11 NOTE — Cardiovascular Report (Signed)
NAME:  [name], [name]                           ACCOUNT NO.:  [id number]   MEDICAL RECORD NO.:  [id number]                   PATIENT TYPE:  INP   LOCATION:  3736                                 FACILITY:  [facility]   PHYSICIAN:  Darlin Priestly, M.D.             DATE OF BIRTH:  [birth year]   DATE OF PROCEDURE:  06/07/2004  DATE OF DISCHARGE:                              CARDIAC CATHETERIZATION   PROCEDURES:  1.  Left heart catheterization.  2.  Coronary angiography.  3.  Left ventriculogram.  4.  Saphenous vein graft.  5.  Left internal mammary angiography.   ATTENDING PHYSICIAN:  Darlin Priestly, M.D.   COMPLICATIONS:  None.   INDICATIONS:  [name] is a 65 year old female patient of Dr. Daphene Jaeger,  Dr. Garner Nash with history of diabetes, hypertension, hyperlipidemia,  history of coronary artery bypass surgery consisting of LIMA to LAD,  sequential vein graft to diagonal and OM and vein graft to PDA in 2002.  The  patient has had percutaneous transluminal coronary angioplasty of the vein  graft to the diagonal which is a Y-graft supplying both the diagonal and the  OM. The patient's last cath was in April 2005 revealing narrowing in the  portion of the Y-graft to the diagonal up to 40%.  She was readmitted on  June 04, 2004 with substernal chest pain and subsequently ruled out for  myocardial infarction.  She is now referred for repeat catheterization to  reassess her graft status.   DESCRIPTION OF OPERATION:  After obtaining informed written consent, the  patient was brought to the cardiac catheterization laboratory.  Right and  left groin were shaved and prepped and draped in the usual sterile fashion.  ECG monitor was established.  Using the modified Seldinger technique, a 6  French arterial sheath was inserted in the right femoral artery. A 6 French  diagnostic catheter was then used to performed diagnostic angiography.  This  reveals a medium size left main which  was irregular, but has no high grade  stenosis.   The LAD is diffusely diseased in its proximal segment up to 50-60% and  totally occluded in its early mid portion.  The mid and distal LAD fill via  patent LIMA to the LAD.  The IMA is widely patent.  The remainder of the LAD  is patent, but small vessel.   There is one diagonal branch which fills via a Y-graft supplying both  diagonal and first OM.  The limb of the Y-graft to the diagonal is noted to  have a 40-50% stenosis in its proximal portion, but otherwise is widely  patent.  The remainder of the graft and diagonal have no significant  disease.  The portion of the Y-graft which goes to the obtuse marginal is  also widely patent.  There is a mild 30% narrowing at the insertion site of  the OM.  There  is no significant disease in the OM beyond the insertion  site.   Left circumflex is a medium size vessel which courses in the AV groove and  gives rise to two obtuse marginal branches.  The first OM is a medium size  vessel which as previously stated is supplied by Y-graft in its mid portion.  There is a 70% lesion just proximal to the graft insertion.  The second OM  is a small vessel with no significant disease.   The right coronary artery is a small vessel which is diffusely diseased up  to 70% in its proximal portion.  There is competitive flow in the distal  RCA.   The saphenous vein graft to the RCA is widely patent.  There is some  narrowing at the insertion site on the RCA.  However, there is brisk flow  into the RCA and into the PDA and posterior lateral branch.   LEFT VENTRICULOGRAM:  Left ventriculogram reveals mildly depressed EF at 30-  35% with global hypokinesis.  There appears to be apical akinesis.   HEMODYNAMICS:  Systemic arterial pressure 164/71, LV pressure 160/19, LVEDP  of 25.   CONCLUSIONS:  1.  Significant three-vessel coronary artery disease.  2.  Patent left internal mammary artery to left anterior  descending.  3.  Patent Y-graft to the diagonal and obtuse marginal with 40-50% stenosis      in the proximal portion of the Y-graft to the diagonal and a 30%      stenosis in the insertion of the first obtuse marginal.  4.  Patent saphenous vein graft to the right coronary artery with 50%      stenosis beyond the graft insertion.  5.  Moderately depression left ventricular systolic function.  6.  Systemic hypertension.  7.  Elevated left ventricular end-diastolic pressure.                                               Darlin Priestly, M.D.    RHM/MEDQ  D:  06/07/2004  T:  06/07/2004  Job:  [id number]   cc:   Olene Craven, M.D.  [address]  [address]  [place]  [place] [address]  Fax: [phone]

## 2012-06-01 ENCOUNTER — Other Ambulatory Visit: Payer: Self-pay | Admitting: Cardiology

## 2014-12-12 ENCOUNTER — Emergency Department (HOSPITAL_BASED_OUTPATIENT_CLINIC_OR_DEPARTMENT_OTHER)
Admission: EM | Admit: 2014-12-12 | Discharge: 2014-12-13 | Disposition: A | Discharge status: Home / Self Care | Payer: Medicare Other | Attending: Emergency Medicine | Admitting: Emergency Medicine

## 2014-12-12 ENCOUNTER — Encounter (HOSPITAL_BASED_OUTPATIENT_CLINIC_OR_DEPARTMENT_OTHER): Payer: Self-pay | Admitting: Emergency Medicine

## 2014-12-12 DIAGNOSIS — R739 Hyperglycemia, unspecified: Secondary | ICD-10-CM

## 2014-12-12 DIAGNOSIS — I2510 Atherosclerotic heart disease of native coronary artery without angina pectoris: Secondary | ICD-10-CM | POA: Insufficient documentation

## 2014-12-12 DIAGNOSIS — N183 Chronic kidney disease, stage 3 unspecified: Secondary | ICD-10-CM

## 2014-12-12 DIAGNOSIS — Z951 Presence of aortocoronary bypass graft: Secondary | ICD-10-CM | POA: Insufficient documentation

## 2014-12-12 DIAGNOSIS — E1165 Type 2 diabetes mellitus with hyperglycemia: Secondary | ICD-10-CM | POA: Insufficient documentation

## 2014-12-12 DIAGNOSIS — R42 Dizziness and giddiness: Secondary | ICD-10-CM | POA: Diagnosis present

## 2014-12-12 DIAGNOSIS — N390 Urinary tract infection, site not specified: Secondary | ICD-10-CM | POA: Insufficient documentation

## 2014-12-12 DIAGNOSIS — I10 Essential (primary) hypertension: Secondary | ICD-10-CM | POA: Insufficient documentation

## 2014-12-12 DIAGNOSIS — IMO0002 Reserved for concepts with insufficient information to code with codable children: Secondary | ICD-10-CM

## 2014-12-12 HISTORY — DX: Type 2 diabetes mellitus without complications: E11.9

## 2014-12-12 HISTORY — DX: Essential (primary) hypertension: I10

## 2014-12-12 HISTORY — DX: Atherosclerotic heart disease of native coronary artery without angina pectoris: I25.10

## 2014-12-12 LAB — CBG MONITORING, ED: Glucose-Capillary: 326 mg/dL — ABNORMAL HIGH (ref 70–99)

## 2014-12-12 NOTE — ED Notes (Addendum)
Pt sts blood sugar abnormall high with dizziness and bilateral leg weakness. Called PMD and was told to come to ED. cbg in triage 326. Takes Levemir at night

## 2014-12-13 ENCOUNTER — Emergency Department (HOSPITAL_BASED_OUTPATIENT_CLINIC_OR_DEPARTMENT_OTHER): Payer: Medicare Other

## 2014-12-13 ENCOUNTER — Encounter (HOSPITAL_BASED_OUTPATIENT_CLINIC_OR_DEPARTMENT_OTHER): Payer: Self-pay | Admitting: Emergency Medicine

## 2014-12-13 DIAGNOSIS — E1165 Type 2 diabetes mellitus with hyperglycemia: Secondary | ICD-10-CM | POA: Diagnosis not present

## 2014-12-13 LAB — URINALYSIS, ROUTINE W REFLEX MICROSCOPIC
Bilirubin Urine: NEGATIVE
HGB URINE DIPSTICK: NEGATIVE
Ketones, ur: NEGATIVE mg/dL
Nitrite: NEGATIVE
PH: 6.5 (ref 5.0–8.0)
Protein, ur: NEGATIVE mg/dL
SPECIFIC GRAVITY, URINE: 1.01 (ref 1.005–1.030)
Urobilinogen, UA: 1 mg/dL (ref 0.0–1.0)

## 2014-12-13 LAB — COMPREHENSIVE METABOLIC PANEL
ALT: 17 U/L (ref 0–35)
ANION GAP: 9 (ref 5–15)
AST: 22 U/L (ref 0–37)
Albumin: 4.3 g/dL (ref 3.5–5.2)
Alkaline Phosphatase: 50 U/L (ref 39–117)
BUN: 26 mg/dL — ABNORMAL HIGH (ref 6–23)
CHLORIDE: 100 mmol/L (ref 96–112)
CO2: 25 mmol/L (ref 19–32)
Calcium: 9 mg/dL (ref 8.4–10.5)
Creatinine, Ser: 1.3 mg/dL — ABNORMAL HIGH (ref 0.50–1.10)
GFR calc Af Amer: 48 mL/min — ABNORMAL LOW (ref >90)
GFR calc non Af Amer: 42 mL/min — ABNORMAL LOW (ref >90)
Glucose, Bld: 353 mg/dL — ABNORMAL HIGH (ref 70–99)
Potassium: 4.5 mmol/L (ref 3.5–5.1)
Sodium: 134 mmol/L — ABNORMAL LOW (ref 135–145)
Total Bilirubin: 0.4 mg/dL (ref 0.3–1.2)
Total Protein: 7.2 g/dL (ref 6.0–8.3)

## 2014-12-13 LAB — TROPONIN I: Troponin I: <0.03 ng/mL (ref <0.031)

## 2014-12-13 LAB — CBC WITH DIFFERENTIAL/PLATELET
BASOS PCT: 1 % (ref 0–1)
Basophils Absolute: 0 10*3/uL (ref 0.0–0.1)
Eosinophils Absolute: 0.1 10*3/uL (ref 0.0–0.7)
Eosinophils Relative: 1 % (ref 0–5)
HCT: 37.4 % (ref 36.0–46.0)
HEMOGLOBIN: 12.9 g/dL (ref 12.0–15.0)
Lymphocytes Relative: 37 % (ref 12–46)
Lymphs Abs: 2 10*3/uL (ref 0.7–4.0)
MCH: 30.9 pg (ref 26.0–34.0)
MCHC: 34.5 g/dL (ref 30.0–36.0)
MCV: 89.5 fL (ref 78.0–100.0)
MONOS PCT: 8 % (ref 3–12)
Monocytes Absolute: 0.4 10*3/uL (ref 0.1–1.0)
NEUTROS PCT: 53 % (ref 43–77)
Neutro Abs: 2.8 10*3/uL (ref 1.7–7.7)
PLATELETS: 139 10*3/uL — AB (ref 150–400)
RBC: 4.18 MIL/uL (ref 3.87–5.11)
RDW: 12.5 % (ref 11.5–15.5)
WBC: 5.3 10*3/uL (ref 4.0–10.5)

## 2014-12-13 LAB — URINE MICROSCOPIC-ADD ON

## 2014-12-13 LAB — CBG MONITORING, ED: Glucose-Capillary: 250 mg/dL — ABNORMAL HIGH (ref 70–99)

## 2014-12-13 MED ORDER — KETOROLAC TROMETHAMINE 30 MG/ML IJ SOLN
30.0000 mg | Freq: Once | INTRAMUSCULAR | Status: AC
  Administered 2014-12-13: 30 mg via INTRAVENOUS
  Filled 2014-12-13: qty 1

## 2014-12-13 MED ORDER — SODIUM CHLORIDE 0.9 % IV BOLUS (SEPSIS)
1000.0000 mL | Freq: Once | INTRAVENOUS | Status: AC
  Administered 2014-12-13: 1000 mL via INTRAVENOUS

## 2014-12-13 MED ORDER — SULFAMETHOXAZOLE-TRIMETHOPRIM 800-160 MG PO TABS
1.0000 | ORAL_TABLET | Freq: Once | ORAL | Status: AC
  Administered 2014-12-13: 1 via ORAL
  Filled 2014-12-13: qty 1

## 2014-12-13 MED ORDER — SULFAMETHOXAZOLE-TRIMETHOPRIM 800-160 MG PO TABS: 1.0000 | ORAL_TABLET | Freq: Two times a day (BID) | ORAL | Status: DC

## 2014-12-13 NOTE — ED Provider Notes (Signed)
CSN: [id number]     Arrival date & time 12/12/14  2214 History   First MD Initiated Contact with Patient 12/13/14 0002     Chief Complaint  Patient presents with  . Dizziness     (Consider location/radiation/quality/duration/timing/severity/associated sxs/prior Treatment) Patient is a 69 y.o. female presenting with dizziness and hyperglycemia. The history is provided by the patient.  Dizziness Quality:  Lightheadedness Severity:  Moderate Onset quality:  Sudden Duration:  1 day Timing:  Constant Progression:  Unchanged Chronicity:  New Context: not when bending over, not with loss of consciousness and not with physical activity   Relieved by:  Nothing Worsened by:  Nothing Ineffective treatments:  None tried Associated symptoms: no syncope, no vomiting and no weakness   Risk factors: no anemia   Hyperglycemia Severity:  Moderate Onset quality:  Gradual Duration:  1 day Timing:  Constant Progression:  Unchanged Chronicity:  New Diabetes status:  Controlled with insulin Context: recent change in diet   Context: not change in medication   Relieved by:  Nothing Ineffective treatments:  None tried Associated symptoms: dizziness   Associated symptoms: no abdominal pain, no dehydration, no fever, no syncope, no vomiting and no weakness   Risk factors: no pregnancy     Past Medical History  Diagnosis Date  . Diabetes mellitus without complication   . Hypertension   . Coronary artery disease    Past Surgical History  Procedure Laterality Date  . Coronary artery bypass graft    . Bladder tack    . Cholecystectomy    . Abdominal hysterectomy    . Fracture surgery    . Cardiac surgery     History reviewed. No pertinent family history. History  Substance Use Topics  . Smoking status: Never Smoker   . Smokeless tobacco: Not on file  . Alcohol Use: No   OB History    No data available     Review of Systems  Constitutional: Negative for fever.  Cardiovascular:  Negative for syncope.  Gastrointestinal: Negative for vomiting and abdominal pain.  Neurological: Positive for dizziness. Negative for facial asymmetry, weakness and numbness.  All other systems reviewed and are negative.     Allergies  Phenergan  Home Medications   Prior to Admission medications   Not on File   BP 118/63 mmHg  Pulse 84  Temp(Src) 97.6 F (36.4 C) (Oral)  Resp 22  Ht 5\' 3"  (1.6 m)  Wt 160 lb (72.576 kg)  BMI 28.35 kg/m2  SpO2 99% Physical Exam  Constitutional: She is oriented to person, place, and time. She appears well-developed and well-nourished. No distress.  HENT:  Head: Normocephalic and atraumatic.  Mouth/Throat: Oropharynx is clear and moist.  Eyes: Conjunctivae and EOM are normal. Pupils are equal, round, and reactive to light.  Neck: Normal range of motion. Neck supple.  Cardiovascular: Normal rate, regular rhythm and intact distal pulses.   Pulmonary/Chest: Effort normal and breath sounds normal. No respiratory distress. She has no wheezes. She has no rales.  Abdominal: Soft. Bowel sounds are normal. There is no tenderness. There is no rebound and no guarding.  Musculoskeletal: Normal range of motion.  Neurological: She is alert and oriented to person, place, and time. She has normal reflexes. No cranial nerve deficit.  Skin: Skin is warm and dry.  Psychiatric: She has a normal mood and affect.    ED Course  Procedures (including critical care time) Labs Review Labs Reviewed  CBG MONITORING, ED - Abnormal; Notable for  the following:    Glucose-Capillary 326 (*)    All other components within normal limits  CBC WITH DIFFERENTIAL/PLATELET  COMPREHENSIVE METABOLIC PANEL  URINALYSIS, ROUTINE W REFLEX MICROSCOPIC  TROPONIN I    Imaging Review No results found.   EKG Interpretation None      MDM   Final diagnoses:  High blood sugar  Highly doubt CVA as is lightheadedness and no unilateral weakness no numbness no changes in vision  nor speech   EKG Interpretation  Date/Time:  Saturday December 13 2014 00:31:30 EDT Ventricular Rate:  65 PR Interval:  156 QRS Duration: 86 QT Interval:  416 QTC Calculation: 432 R Axis:   67 Text Interpretation:  Normal sinus rhythm Confirmed by [facility]  MD, Rudolph Daoust ([id number]) on 12/13/2014 1:10:10 AM     Symptoms markedly improved in the ED as is sugar.  Walked without assistance.  Will treat for UTI.  Strict return precautions given.  Patient verbalizes understanding and agrees to follow up   Dimitrios Balestrieri, MD 12/13/14 [phone]

## 2014-12-13 NOTE — ED Notes (Signed)
Pt states blood sugar went up about mid afternoon, had some dizziness and bilateral leg weakness

## 2015-02-15 ENCOUNTER — Inpatient Hospital Stay (HOSPITAL_COMMUNITY)
Admission: EM | Admit: 2015-02-15 | Discharge: 2015-02-18 | DRG: 392 | Disposition: A | Discharge status: Home Health | Payer: Medicare Other | Attending: Family Medicine | Admitting: Family Medicine

## 2015-02-15 DIAGNOSIS — R195 Other fecal abnormalities: Secondary | ICD-10-CM | POA: Insufficient documentation

## 2015-02-15 DIAGNOSIS — R55 Syncope and collapse: Secondary | ICD-10-CM | POA: Diagnosis not present

## 2015-02-15 DIAGNOSIS — K529 Noninfective gastroenteritis and colitis, unspecified: Principal | ICD-10-CM | POA: Diagnosis present

## 2015-02-15 DIAGNOSIS — Z951 Presence of aortocoronary bypass graft: Secondary | ICD-10-CM | POA: Diagnosis not present

## 2015-02-15 DIAGNOSIS — D649 Anemia, unspecified: Secondary | ICD-10-CM | POA: Diagnosis present

## 2015-02-15 DIAGNOSIS — IMO0002 Reserved for concepts with insufficient information to code with codable children: Secondary | ICD-10-CM

## 2015-02-15 DIAGNOSIS — I129 Hypertensive chronic kidney disease with stage 1 through stage 4 chronic kidney disease, or unspecified chronic kidney disease: Secondary | ICD-10-CM | POA: Diagnosis present

## 2015-02-15 DIAGNOSIS — Z7982 Long term (current) use of aspirin: Secondary | ICD-10-CM | POA: Diagnosis not present

## 2015-02-15 DIAGNOSIS — I10 Essential (primary) hypertension: Secondary | ICD-10-CM | POA: Diagnosis present

## 2015-02-15 DIAGNOSIS — N183 Chronic kidney disease, stage 3 unspecified: Secondary | ICD-10-CM

## 2015-02-15 DIAGNOSIS — E1165 Type 2 diabetes mellitus with hyperglycemia: Secondary | ICD-10-CM | POA: Diagnosis present

## 2015-02-15 DIAGNOSIS — N179 Acute kidney failure, unspecified: Secondary | ICD-10-CM

## 2015-02-15 DIAGNOSIS — K625 Hemorrhage of anus and rectum: Secondary | ICD-10-CM | POA: Diagnosis present

## 2015-02-15 DIAGNOSIS — R109 Unspecified abdominal pain: Secondary | ICD-10-CM | POA: Diagnosis not present

## 2015-02-15 DIAGNOSIS — K921 Melena: Secondary | ICD-10-CM | POA: Diagnosis present

## 2015-02-15 DIAGNOSIS — Z794 Long term (current) use of insulin: Secondary | ICD-10-CM | POA: Diagnosis not present

## 2015-02-15 DIAGNOSIS — R739 Hyperglycemia, unspecified: Secondary | ICD-10-CM | POA: Insufficient documentation

## 2015-02-15 DIAGNOSIS — R197 Diarrhea, unspecified: Secondary | ICD-10-CM

## 2015-02-15 DIAGNOSIS — E869 Volume depletion, unspecified: Secondary | ICD-10-CM | POA: Diagnosis present

## 2015-02-15 DIAGNOSIS — I2510 Atherosclerotic heart disease of native coronary artery without angina pectoris: Secondary | ICD-10-CM | POA: Diagnosis present

## 2015-02-15 DIAGNOSIS — E785 Hyperlipidemia, unspecified: Secondary | ICD-10-CM | POA: Diagnosis present

## 2015-02-15 LAB — CBC
HCT: 36.2 % (ref 36.0–46.0)
HEMOGLOBIN: 12.7 g/dL (ref 12.0–15.0)
MCH: 31.4 pg (ref 26.0–34.0)
MCHC: 35.1 g/dL (ref 30.0–36.0)
MCV: 89.4 fL (ref 78.0–100.0)
Platelets: 130 10*3/uL — ABNORMAL LOW (ref 150–400)
RBC: 4.05 MIL/uL (ref 3.87–5.11)
RDW: 12.7 % (ref 11.5–15.5)
WBC: 9.4 10*3/uL (ref 4.0–10.5)

## 2015-02-15 LAB — COMPREHENSIVE METABOLIC PANEL
ALK PHOS: 41 U/L (ref 38–126)
ALT: 16 U/L (ref 14–54)
AST: 23 U/L (ref 15–41)
Albumin: 3.3 g/dL — ABNORMAL LOW (ref 3.5–5.0)
Anion gap: 8 (ref 5–15)
BUN: 18 mg/dL (ref 6–20)
CALCIUM: 9 mg/dL (ref 8.9–10.3)
CO2: 23 mmol/L (ref 22–32)
CREATININE: 1.56 mg/dL — AB (ref 0.44–1.00)
Chloride: 104 mmol/L (ref 101–111)
GFR, EST AFRICAN AMERICAN: 39 mL/min — AB (ref >60)
GFR, EST NON AFRICAN AMERICAN: 34 mL/min — AB (ref >60)
GLUCOSE: 364 mg/dL — AB (ref 65–99)
POTASSIUM: 4.4 mmol/L (ref 3.5–5.1)
Sodium: 135 mmol/L (ref 135–145)
TOTAL PROTEIN: 6 g/dL — AB (ref 6.5–8.1)
Total Bilirubin: 0.7 mg/dL (ref 0.3–1.2)

## 2015-02-15 LAB — POC OCCULT BLOOD, ED: FECAL OCCULT BLD: POSITIVE — AB

## 2015-02-15 LAB — TROPONIN I

## 2015-02-15 MED ORDER — SODIUM CHLORIDE 0.9 % IV BOLUS (SEPSIS)
1000.0000 mL | Freq: Once | INTRAVENOUS | Status: AC
  Administered 2015-02-15: 1000 mL via INTRAVENOUS

## 2015-02-15 MED ORDER — SODIUM CHLORIDE 0.9 % IV SOLN
INTRAVENOUS | Status: AC
  Administered 2015-02-16: via INTRAVENOUS

## 2015-02-15 MED ORDER — ONDANSETRON HCL 4 MG/2ML IJ SOLN
4.0000 mg | Freq: Once | INTRAMUSCULAR | Status: AC
  Administered 2015-02-15: 4 mg via INTRAVENOUS
  Filled 2015-02-15: qty 2

## 2015-02-15 MED ORDER — INSULIN ASPART 100 UNIT/ML ~~LOC~~ SOLN
10.0000 [IU] | Freq: Once | SUBCUTANEOUS | Status: AC
  Administered 2015-02-15: 10 [IU] via SUBCUTANEOUS
  Filled 2015-02-15: qty 1

## 2015-02-15 MED ORDER — SODIUM CHLORIDE 0.9 % IV BOLUS (SEPSIS)
500.0000 mL | Freq: Once | INTRAVENOUS | Status: AC
  Administered 2015-02-15: 500 mL via INTRAVENOUS

## 2015-02-15 NOTE — ED Notes (Signed)
Pt st's she had not had a BM in 2-3 days  Today was having diarrhea with abd cramping and had syncopal episode while on commode.  Pt denies any injury only  C/o abd cramping  Pt on bedpan at this time.  Also c/o nausea

## 2015-02-15 NOTE — ED Provider Notes (Signed)
CSN: [id number]     Arrival date & time 02/15/15  1946 History   First MD Initiated Contact with Patient 02/15/15 1956     Chief Complaint  Patient presents with  . Loss of Consciousness     (Consider location/radiation/quality/duration/timing/severity/associated sxs/prior Treatment) Patient is a 69 y.o. female presenting with syncope. The history is provided by the patient.  Loss of Consciousness Associated symptoms: no chest pain, no confusion, no fever, no headaches, no shortness of breath, no vomiting and no weakness   Patient w hx cad, c/o feeling faint/lightheaded this evening at home w brief syncopal event.  Pt states was feeling nauseated, then felt as if had to have bm.  Before getting to bathroom had diarrheal stool on self/down leg, and then tried to make way to bathroom.  Was feeling more lightheaded upon standing, and then awoke on bathroom floor. Denies injury or new pain. No vomiting. No abd pain. No dysuria, odor to urine, or other gu c/o. No fever or chills.  Denies chest pain or discomfort. No sob. No cough or uri c/o. No headaches. No neck or back pain. No recent blood loss, no rectal bleeding or melena.      Past Medical History  Diagnosis Date  . Diabetes mellitus without complication   . Hypertension   . Coronary artery disease    Past Surgical History  Procedure Laterality Date  . Coronary artery bypass graft    . Bladder tack    . Cholecystectomy    . Abdominal hysterectomy    . Fracture surgery    . Cardiac surgery     No family history on file. History  Substance Use Topics  . Smoking status: Never Smoker   . Smokeless tobacco: Not on file  . Alcohol Use: No   OB History    No data available     Review of Systems  Constitutional: Negative for fever and chills.  HENT: Negative for sore throat.   Eyes: Negative for visual disturbance.  Respiratory: Negative for cough and shortness of breath.   Cardiovascular: Positive for syncope. Negative for  chest pain and leg swelling.  Gastrointestinal: Negative for vomiting, abdominal pain and blood in stool.  Genitourinary: Negative for dysuria and flank pain.  Musculoskeletal: Negative for back pain and neck pain.  Skin: Negative for rash and wound.  Neurological: Positive for light-headedness. Negative for weakness, numbness and headaches.  Hematological: Does not bruise/bleed easily.  Psychiatric/Behavioral: Negative for confusion.      Allergies  Phenergan  Home Medications   Prior to Admission medications   Medication Sig Start Date End Date Taking? Authorizing Provider  sulfamethoxazole-trimethoprim (SEPTRA DS) 800-160 MG per tablet Take 1 tablet by mouth 2 (two) times daily. 12/13/14   April Palumbo, MD   There were no vitals taken for this visit. Physical Exam  Constitutional: She is oriented to person, place, and time. She appears well-developed and well-nourished. No distress.  HENT:  Head: Atraumatic.  Mouth/Throat: Oropharynx is clear and moist.  Eyes: Conjunctivae are normal. Pupils are equal, round, and reactive to light. No scleral icterus.  Neck: Neck supple. No tracheal deviation present.  Cardiovascular: Normal rate, regular rhythm, normal heart sounds and intact distal pulses.   Pulmonary/Chest: Effort normal and breath sounds normal. No respiratory distress.  Abdominal: Soft. Normal appearance and bowel sounds are normal. She exhibits no distension and no mass. There is no tenderness. There is no rebound and no guarding.  No puls mass.   Genitourinary:  No cva tenderness. No rectal, no formed stool, small amt red blood w small clots. No hemorrhoids/fissures noted.   Musculoskeletal: Normal range of motion. She exhibits no edema.  CTLS spine, non tender, aligned, no step off. Good rom bil ext without pain or focal bony tenderness. Distal pulses palp.   Neurological: She is alert and oriented to person, place, and time. No cranial nerve deficit.  Motor intact  bil, stre 5/5. sens grossly intact.   Skin: Skin is warm and dry. No rash noted. She is not diaphoretic.  Psychiatric: She has a normal mood and affect.  Nursing note and vitals reviewed.   ED Course  Procedures (including critical care time) Labs Review   Results for orders placed or performed during the hospital encounter of 02/15/15  CBC  Result Value Ref Range   WBC 9.4 4.0 - 10.5 K/uL   RBC 4.05 3.87 - 5.11 MIL/uL   Hemoglobin 12.7 12.0 - 15.0 g/dL   HCT 40.936.2 81.136.0 - 91.446.0 %   MCV 89.4 78.0 - 100.0 fL   MCH 31.4 26.0 - 34.0 pg   MCHC 35.1 30.0 - 36.0 g/dL   RDW 78.212.7 95.611.5 - 21.315.5 %   Platelets 130 (L) 150 - 400 K/uL  Comprehensive metabolic panel  Result Value Ref Range   Sodium 135 135 - 145 mmol/L   Potassium 4.4 3.5 - 5.1 mmol/L   Chloride 104 101 - 111 mmol/L   CO2 23 22 - 32 mmol/L   Glucose, Bld 364 (H) 65 - 99 mg/dL   BUN 18 6 - 20 mg/dL   Creatinine, Ser 0.861.56 (H) 0.44 - 1.00 mg/dL   Calcium 9.0 8.9 - 57.810.3 mg/dL   Total Protein 6.0 (L) 6.5 - 8.1 g/dL   Albumin 3.3 (L) 3.5 - 5.0 g/dL   AST 23 15 - 41 U/L   ALT 16 14 - 54 U/L   Alkaline Phosphatase 41 38 - 126 U/L   Total Bilirubin 0.7 0.3 - 1.2 mg/dL   GFR calc non Af Amer 34 (L) >60 mL/min   GFR calc Af Amer 39 (L) >60 mL/min   Anion gap 8 5 - 15  Troponin I  Result Value Ref Range   Troponin I <0.03 <0.031 ng/mL  POC occult blood, ED RN will collect  Result Value Ref Range   Fecal Occult Bld POSITIVE (A) NEGATIVE       EKG Interpretation   Date/Time:  Sunday Feb 15 2015 20:28:55 EDT Ventricular Rate:  69 PR Interval:  164 QRS Duration: 90 QT Interval:  417 QTC Calculation: 447 R Axis:   40 Text Interpretation:  Sinus rhythm Nonspecific T wave abnormality `no  acute change compared to ecg 07/01/09 Confirmed by Denton LankSTEINL  MD, Caryn BeeKEVIN  ([id number]) on 02/15/2015 8:51:43 PM      MDM   Iv ns. Monitor. Labs.  Reviewed nursing notes and prior charts for additional history.   Glucose elevated 364,  additional ns bolus, novolog sq.  Given syncope, heme pos/grossly bloody stool/recent diarrhea - will admit.  Ivf. Type and screen.  hgb currently c/w baseline.  Hospitalists paged for admission.  Recheck abd soft nt.       Cathren LaineKevin Avontae Burkhead, MD 02/15/15 2158

## 2015-02-15 NOTE — ED Notes (Signed)
Lying  B/P 102/53  P 73 Sitting  B/P  90/45  P  82 Standing B/P 78/43  P 86

## 2015-02-16 ENCOUNTER — Inpatient Hospital Stay (HOSPITAL_COMMUNITY): Payer: Medicare Other

## 2015-02-16 ENCOUNTER — Encounter (HOSPITAL_COMMUNITY): Payer: Self-pay | Admitting: Internal Medicine

## 2015-02-16 DIAGNOSIS — R195 Other fecal abnormalities: Secondary | ICD-10-CM | POA: Insufficient documentation

## 2015-02-16 DIAGNOSIS — IMO0002 Reserved for concepts with insufficient information to code with codable children: Secondary | ICD-10-CM | POA: Diagnosis present

## 2015-02-16 DIAGNOSIS — E1165 Type 2 diabetes mellitus with hyperglycemia: Secondary | ICD-10-CM

## 2015-02-16 DIAGNOSIS — K625 Hemorrhage of anus and rectum: Secondary | ICD-10-CM | POA: Diagnosis present

## 2015-02-16 DIAGNOSIS — N183 Chronic kidney disease, stage 3 unspecified: Secondary | ICD-10-CM | POA: Diagnosis present

## 2015-02-16 DIAGNOSIS — R109 Unspecified abdominal pain: Secondary | ICD-10-CM

## 2015-02-16 DIAGNOSIS — R55 Syncope and collapse: Secondary | ICD-10-CM

## 2015-02-16 DIAGNOSIS — R197 Diarrhea, unspecified: Secondary | ICD-10-CM

## 2015-02-16 DIAGNOSIS — I10 Essential (primary) hypertension: Secondary | ICD-10-CM | POA: Diagnosis present

## 2015-02-16 LAB — CBC
HCT: 32.2 % — ABNORMAL LOW (ref 36.0–46.0)
HEMATOCRIT: 33.6 % — AB (ref 36.0–46.0)
HEMATOCRIT: 33 % — AB (ref 36.0–46.0)
Hemoglobin: 11.3 g/dL — ABNORMAL LOW (ref 12.0–15.0)
Hemoglobin: 11.5 g/dL — ABNORMAL LOW (ref 12.0–15.0)
Hemoglobin: 11.6 g/dL — ABNORMAL LOW (ref 12.0–15.0)
MCH: 31.5 pg (ref 26.0–34.0)
MCH: 30.5 pg (ref 26.0–34.0)
MCH: 31.4 pg (ref 26.0–34.0)
MCHC: 35.1 g/dL (ref 30.0–36.0)
MCHC: 34.2 g/dL (ref 30.0–36.0)
MCHC: 35.2 g/dL (ref 30.0–36.0)
MCV: 89.7 fL (ref 78.0–100.0)
MCV: 89.1 fL (ref 78.0–100.0)
MCV: 89.4 fL (ref 78.0–100.0)
PLATELETS: 173 10*3/uL (ref 150–400)
Platelets: 130 10*3/uL — ABNORMAL LOW (ref 150–400)
Platelets: 129 10*3/uL — ABNORMAL LOW (ref 150–400)
RBC: 3.59 MIL/uL — ABNORMAL LOW (ref 3.87–5.11)
RBC: 3.77 MIL/uL — AB (ref 3.87–5.11)
RBC: 3.69 MIL/uL — AB (ref 3.87–5.11)
RDW: 12.8 % (ref 11.5–15.5)
RDW: 12.7 % (ref 11.5–15.5)
RDW: 12.8 % (ref 11.5–15.5)
WBC: 6.2 10*3/uL (ref 4.0–10.5)
WBC: 6.2 10*3/uL (ref 4.0–10.5)
WBC: 6.3 10*3/uL (ref 4.0–10.5)

## 2015-02-16 LAB — URINALYSIS, ROUTINE W REFLEX MICROSCOPIC
BILIRUBIN URINE: NEGATIVE
Glucose, UA: 250 mg/dL — AB
Ketones, ur: NEGATIVE mg/dL
NITRITE: NEGATIVE
PROTEIN: NEGATIVE mg/dL
SPECIFIC GRAVITY, URINE: 1.006 (ref 1.005–1.030)
UROBILINOGEN UA: 0.2 mg/dL (ref 0.0–1.0)
pH: 5 (ref 5.0–8.0)

## 2015-02-16 LAB — PROTIME-INR
INR: 1.2 (ref 0.00–1.49)
Prothrombin Time: 15.3 seconds — ABNORMAL HIGH (ref 11.6–15.2)

## 2015-02-16 LAB — TYPE AND SCREEN
ABO/RH(D): A POS
ANTIBODY SCREEN: NEGATIVE

## 2015-02-16 LAB — URINE MICROSCOPIC-ADD ON: RBC / HPF: NONE SEEN RBC/hpf (ref <3)

## 2015-02-16 LAB — COMPREHENSIVE METABOLIC PANEL
ALT: 14 U/L (ref 14–54)
AST: 20 U/L (ref 15–41)
Albumin: 3.1 g/dL — ABNORMAL LOW (ref 3.5–5.0)
Alkaline Phosphatase: 36 U/L — ABNORMAL LOW (ref 38–126)
Anion gap: 6 (ref 5–15)
BUN: 15 mg/dL (ref 6–20)
CALCIUM: 8.7 mg/dL — AB (ref 8.9–10.3)
CO2: 25 mmol/L (ref 22–32)
Chloride: 109 mmol/L (ref 101–111)
Creatinine, Ser: 1.28 mg/dL — ABNORMAL HIGH (ref 0.44–1.00)
GFR calc Af Amer: 49 mL/min — ABNORMAL LOW (ref >60)
GFR, EST NON AFRICAN AMERICAN: 43 mL/min — AB (ref >60)
Glucose, Bld: 186 mg/dL — ABNORMAL HIGH (ref 65–99)
Potassium: 4.1 mmol/L (ref 3.5–5.1)
SODIUM: 140 mmol/L (ref 135–145)
Total Bilirubin: 0.6 mg/dL (ref 0.3–1.2)
Total Protein: 5.5 g/dL — ABNORMAL LOW (ref 6.5–8.1)

## 2015-02-16 LAB — GLUCOSE, CAPILLARY
Glucose-Capillary: 308 mg/dL — ABNORMAL HIGH (ref 65–99)
Glucose-Capillary: 155 mg/dL — ABNORMAL HIGH (ref 65–99)
Glucose-Capillary: 239 mg/dL — ABNORMAL HIGH (ref 65–99)
Glucose-Capillary: 242 mg/dL — ABNORMAL HIGH (ref 65–99)
Glucose-Capillary: 200 mg/dL — ABNORMAL HIGH (ref 65–99)

## 2015-02-16 LAB — LACTIC ACID, PLASMA: LACTIC ACID, VENOUS: 1.3 mmol/L (ref 0.5–2.0)

## 2015-02-16 LAB — TROPONIN I: Troponin I: <0.03 ng/mL (ref <0.031)

## 2015-02-16 MED ORDER — INSULIN DETEMIR 100 UNIT/ML ~~LOC~~ SOLN
10.0000 [IU] | Freq: Two times a day (BID) | SUBCUTANEOUS | Status: DC
  Administered 2015-02-16 – 2015-02-18 (×6): 10 [IU] via SUBCUTANEOUS
  Filled 2015-02-16 (×7): qty 0.1

## 2015-02-16 MED ORDER — ACETAMINOPHEN 325 MG PO TABS: 650.0000 mg | ORAL_TABLET | Freq: Four times a day (QID) | ORAL | Status: DC | PRN

## 2015-02-16 MED ORDER — SODIUM CHLORIDE 0.9 % IV SOLN
INTRAVENOUS | Status: AC
  Administered 2015-02-16 (×3): via INTRAVENOUS

## 2015-02-16 MED ORDER — CARVEDILOL 25 MG PO TABS
25.0000 mg | ORAL_TABLET | Freq: Two times a day (BID) | ORAL | Status: DC
  Administered 2015-02-16 – 2015-02-17 (×4): 25 mg via ORAL
  Filled 2015-02-16 (×6): qty 1

## 2015-02-16 MED ORDER — SIMVASTATIN 20 MG PO TABS
20.0000 mg | ORAL_TABLET | Freq: Every day | ORAL | Status: DC
  Administered 2015-02-16 – 2015-02-18 (×3): 20 mg via ORAL
  Filled 2015-02-16 (×3): qty 1

## 2015-02-16 MED ORDER — ACETAMINOPHEN 650 MG RE SUPP: 650.0000 mg | Freq: Four times a day (QID) | RECTAL | Status: DC | PRN

## 2015-02-16 MED ORDER — PANTOPRAZOLE SODIUM 40 MG PO TBEC
40.0000 mg | DELAYED_RELEASE_TABLET | Freq: Every day | ORAL | Status: DC
  Administered 2015-02-16 – 2015-02-18 (×3): 40 mg via ORAL
  Filled 2015-02-16 (×2): qty 1

## 2015-02-16 MED ORDER — GABAPENTIN 600 MG PO TABS
600.0000 mg | ORAL_TABLET | Freq: Three times a day (TID) | ORAL | Status: DC
  Administered 2015-02-16 – 2015-02-18 (×6): 600 mg via ORAL
  Filled 2015-02-16 (×8): qty 1

## 2015-02-16 MED ORDER — INSULIN ASPART 100 UNIT/ML ~~LOC~~ SOLN
0.0000 [IU] | Freq: Three times a day (TID) | SUBCUTANEOUS | Status: DC
  Administered 2015-02-16: 3 [IU] via SUBCUTANEOUS
  Administered 2015-02-16: 2 [IU] via SUBCUTANEOUS
  Administered 2015-02-16: 3 [IU] via SUBCUTANEOUS
  Administered 2015-02-17: 2 [IU] via SUBCUTANEOUS
  Administered 2015-02-17: 3 [IU] via SUBCUTANEOUS

## 2015-02-16 MED ORDER — IOHEXOL 300 MG/ML  SOLN
25.0000 mL | INTRAMUSCULAR | Status: AC
  Administered 2015-02-16 (×2): 25 mL via ORAL

## 2015-02-16 MED ORDER — ONDANSETRON HCL 4 MG/2ML IJ SOLN: 4.0000 mg | Freq: Four times a day (QID) | INTRAMUSCULAR | Status: DC | PRN

## 2015-02-16 MED ORDER — ONDANSETRON HCL 4 MG PO TABS: 4.0000 mg | ORAL_TABLET | Freq: Four times a day (QID) | ORAL | Status: DC | PRN

## 2015-02-16 MED ORDER — GABAPENTIN 300 MG PO CAPS
600.0000 mg | ORAL_CAPSULE | Freq: Three times a day (TID) | ORAL | Status: DC
  Filled 2015-02-16: qty 2

## 2015-02-16 NOTE — Progress Notes (Signed)
As per order, orthostatic vitals were performed with the following positive results: Lying   BP 133/62;  HR 70 Sitting   BP 99/85;  HR 77 Standing   BP 90/58;  HR 79 Standing at 3 min   BP 74/58;  HR 78.

## 2015-02-16 NOTE — Care Management Note (Signed)
Case Management Note  Patient Details  Name: Katie RilesLinda K Martin MRN: [id number] Date of Birth: [birth year]  Subjective/Objective:   Admitted with syncopy / rectal bleeding             Action/Plan: CM following for DCP  Expected Discharge Date:    possibly 02/19/2015              Status of Service:   In progress   Reola MosherChandler, Keriann Rankin L, RN,BSN,MHA [phone] 02/16/2015, 11:51 AM

## 2015-02-16 NOTE — H&P (Signed)
[facility] History and Physical  [name] ZOX:[id number] DOB: 1946/06/27 DOA: 02/15/2015  Referring physician: Sr.Stinel. PCP: Pcp Not In System  Specialists: Dr.Harwani.Cardiologist.                      Dr.Hayes.GI.  Chief Complaint: Diarrhea and loss of consciousness.  HPI: [name] is a 69 y.o. female history of CAD status post CABG, diabetes mellitus type 2, hypertension, hyperlipidemia, chronic anemia, chronic kidney disease was brought to the ER after patient had a brief episode of loss of consciousness. Patient states that around 6 PM patient started feeling weak and unwell. Patient started having diarrhea and after 3 episodes when patient was trying to stand up from the commode patient felt dizzy and lost consciousness and the next thing patient remembers was patient was on the floor. Patient did not have any incontinence of urine or tongue bite. Patient states she may have lost consciousness for less than a minute. Patient was brought to the ER and in the ER patient was found to be orthostatic and was given 1.5 L fluid bolus. In the ER patient continued to have diarrhea and was found to be having blood clots in the diarrhea. Patient has been also having crampy abdominal pain since the diarrhea started. Denies any recent travel or any sick contacts. Denies any recent use of antibiotics or hospitalization. Patient is admitted for further management of her syncope and rectal bleeding. Denies any chest pain shortness of breath or palpitations.   Review of Systems: As presented in the history of presenting illness, rest negative.  Past Medical History  Diagnosis Date  . Diabetes mellitus without complication   . Hypertension   . Coronary artery disease    Past Surgical History  Procedure Laterality Date  . Coronary artery bypass graft    . Bladder tack    . Cholecystectomy    . Abdominal hysterectomy    . Fracture surgery    . Cardiac surgery     Social History:   reports that she has never smoked. She does not have any smokeless tobacco history on file. She reports that she does not drink alcohol or use illicit drugs. Where does patient live home. Can patient participate in ADLs? Yes.  Allergies  Allergen Reactions  . Phenergan [Promethazine Hcl] Nausea And Vomiting    Family History:  Family History  Problem Relation Age of Onset  . CAD Other   . CVA Other       Prior to Admission medications   Medication Sig Start Date End Date Taking? Authorizing Provider  aspirin 81 MG tablet Take 81 mg by mouth daily.   Yes Historical Provider, MD  carvedilol (COREG) 25 MG tablet Take 25 mg by mouth 2 (two) times daily with a meal.   Yes Historical Provider, MD  GABAPENTIN PO Take 1 tablet by mouth 3 (three) times daily.   Yes Historical Provider, MD  insulin lispro protamine-lispro (HUMALOG 75/25 MIX) (75-25) 100 UNIT/ML SUSP injection Inject 4-8 Units into the skin 3 (three) times daily. 150-300 = 4 units, over 300 = 8 units   Yes Historical Provider, MD  LEVEMIR FLEXTOUCH 100 UNIT/ML Pen Inject 8-10 Units into the skin 2 (two) times daily. 11/27/14  Yes Historical Provider, MD  meclizine (ANTIVERT) 25 MG tablet Take 25 mg by mouth daily as needed for dizziness.  11/28/14  Yes Historical Provider, MD  omeprazole (PRILOSEC) 20 MG capsule Take 20 mg by mouth daily.  Yes Historical Provider, MD  ramipril (ALTACE) 10 MG capsule Take 10 mg by mouth daily.   Yes Historical Provider, MD  simvastatin (ZOCOR) 20 MG tablet Take 20 mg by mouth daily.   Yes Historical Provider, MD  sulfamethoxazole-trimethoprim (SEPTRA DS) 800-160 MG per tablet Take 1 tablet by mouth 2 (two) times daily. Patient not taking: Reported on 02/15/2015 12/13/14   April Palumbo, MD    Physical Exam: Ceasar Mons Vitals:   02/15/15 2230 02/15/15 2300 02/15/15 2330 02/16/15 0015  BP: 122/52 128/50 132/59 123/67  Pulse: 70 72 76 70  Temp:      TempSrc:      Resp: Height:       Weight:      SpO2: 98% 100% 99% 100%     General:  Well-developed and nourished.  Eyes: Anicteric no pallor.  ENT: No discharge from the ears eyes nose and mouth.  Neck: No mass felt.  Cardiovascular: S1 and S2 heard.  Respiratory: No rhonchi or crepitations.  Abdomen: Soft nontender bowel sounds present.  Skin: No rash.  Musculoskeletal: No edema.  Psychiatric: Appears normal.  Neurologic: Alert awake oriented to time place and person. Moves all extremities 5 x 5. No facial asymmetry.  Labs on Admission:  Basic Metabolic Panel:  Recent Labs Lab 02/15/15 2015  NA 135  K 4.4  CL 104  CO2 23  GLUCOSE 364*  BUN 18  CREATININE 1.56*  CALCIUM 9.0   Liver Function Tests:  Recent Labs Lab 02/15/15 2015  AST 23  ALT 16  ALKPHOS 41  BILITOT 0.7  PROT 6.0*  ALBUMIN 3.3*   No results for input(s): LIPASE, AMYLASE in the last 168 hours. No results for input(s): AMMONIA in the last 168 hours. CBC:  Recent Labs Lab 02/15/15 2015  WBC 9.4  HGB 12.7  HCT 36.2  MCV 89.4  PLT 130*   Cardiac Enzymes:  Recent Labs Lab 02/15/15 2015  TROPONINI <0.03    BNP (last 3 results) No results for input(s): BNP in the last 8760 hours.  ProBNP (last 3 results) No results for input(s): PROBNP in the last 8760 hours.  CBG: No results for input(s): GLUCAP in the last 168 hours.  Radiological Exams on Admission: No results found.  EKG: Independently reviewed. Normal sinus rhythm with low voltage.  Assessment/Plan Principal Problem:   Rectal bleeding Active Problems:   Diarrhea   Syncope   Diabetes mellitus type 2, uncontrolled   CKD (chronic kidney disease) stage 3, GFR 30-59 ml/min   Hypertension   1. Rectal bleeding with diarrhea and abdominal discomfort - at this time patient's symptoms are concerning for colitis. I have ordered CT abdomen and pelvis with by mouth contrast. Patient will be kept on clear liquid diet. Continue with hydration and hold  antihypertensives for now. Check stool studies for C. difficile and cultures. May consult GI in a.m. Patient is known to Dr. Madilyn Fireman. Patient states she has had colonoscopy 2 years ago which was unremarkable. 2. Syncope - patient was orthostatic. Hold antihypertensives and continue hydrations. Recheck orthostatics after adequate hydration. 3. CAD status post CABG - denies any chest pain. Hold aspirin due to rectal bleeding. 4. Diabetes mellitus type 2 uncontrolled - check hemoglobin A1c and closely CBGs and sliding scale coverage. 5. Chronic kidney disease stage III - patient's creatinine in March of this year was also elevated. Closely follow metabolic panel. 6. Hypertension - hold antihypertensives for now due to #1 and 2.  DVT Prophylaxis SCDs.  Code Status: Full code.  Family Communication: Discussed with patient.  Disposition Plan: Admit to inpatient.    KAKRAKANDY,ARSHAD N. [facility] Pager [phone].  If 7PM-7AM, please contact night-coverage [email or web address] Password [email or web address] 02/16/2015, 12:20 AM

## 2015-02-16 NOTE — Progress Notes (Signed)
   [facility]                                                                              Patient Demographics  [name], is a 69 y.o. female, DOB - [birth year], ZHY:[id number]  Admit date - 02/15/2015   Admitting Physician Eduard ClosArshad N Kakrakandy, MD  Outpatient Primary MD for the patient is Pcp Not In System  LOS - 1   Chief Complaint  Patient presents with  . Loss of Consciousness      HPI on 02/16/2015 by Dr. Midge MiniumArshad Kakrakandy  [name] is a 69 y.o. female history of CAD status post CABG, diabetes mellitus type 2, hypertension, hyperlipidemia, chronic anemia, chronic kidney disease was brought to the ER after patient had a brief episode of loss of consciousness. Patient states that around 6 PM patient started feeling weak and unwell. Patient started having diarrhea and after 3 episodes when patient was trying to stand up from the commode patient felt dizzy and lost consciousness and the next thing patient remembers was patient was on the floor. Patient did not have any incontinence of urine or tongue bite. Patient states she may have lost consciousness for less than a minute. Patient was brought to the ER and in the ER patient was found to be orthostatic and was given 1.5 L fluid bolus. In the ER patient continued to have diarrhea and was found to be having blood clots in the diarrhea. Patient has been also having crampy abdominal pain since the diarrhea started. Denies any recent travel or any sick contacts. Denies any recent use of antibiotics or hospitalization. Patient is admitted for further management of her syncope and rectal bleeding. Denies any chest pain shortness of breath or palpitations.   Assessment & Plan   Rectal bleeding with diarrhea/abdominal discomfort -CT  abdomen pelvis: No acute findings in the abdomen or pelvis   -Patient denies further diarrhea or bleeding -Diarrhea possibly secondary to gastroenteritis -Cdiff pending -Currently afebrile, no  leukocytosis -FOBT positive -Hemoglobin stable, currently 11.3 -Spoke with GI: recommended Anusol suppositories, outpatient follow up (03/03/2015 at 8:45am)  Syncope -Likely secondary to volume depletion from diarrhea/hypotension -BP low normal -antihypertensives held  History of CAD s/p CABG -Denies chest pain -Aspirin held due to rectal bleeding  Diabetes mellitus, type 2 -Hemoglobin  A1c pending -Continue ISS and CBG monitoring  Chronic kidney disease, stage 3 -?baseline Cr 1.3 -Was 1.56 upon admission, currently 1.28 -Continue to monitor   Hypertension -Low to normal, continue to monitor, meds held  Code Status: SCDs  Family Communication: None at bedside  Disposition Plan: Admitted  Time Spent in minutes   30 minutes  Procedures  None  Consults   Gastroenterology, via phone  DVT Prophylaxis  SCDs  Kasidy Gianino D.O. on 02/16/2015 at 1:00 PM  Between 7am to 7pm - Pager - [phone]  After 7pm go to [email or web address] - password [email or web address]  And look for the night coverage person covering for me after hours  [facility] Office  [phone]

## 2015-02-16 NOTE — ED Notes (Signed)
Transporting patient to new room assignment. 

## 2015-02-16 NOTE — Progress Notes (Signed)
   02/16/15 0140  Vitals  Temp 97.4 F (36.3 C)  Temp Source Oral  BP (!) 127/43 mmHg  BP Location Right Arm  BP Method Automatic  Patient Position (if appropriate) Lying  Pulse Rate 76  Pulse Rate Source Dinamap  Resp 18  Oxygen Therapy  SpO2 98 %  O2 Device Room Air  Height and Weight  Height 5\' 2"  (1.575 m)  Weight 71.305 kg (157 lb 3.2 oz) (scale B)  Type of Scale Used Standing  BSA (Calculated - sq m) 1.77 sq meters  BMI (Calculated) 28.8  Weight in (lb) to have BMI = 25 136.4  Admitted pt to rm 3E17 from ED, pt alert and oriented, denied pain at this time. Admission orders carried out. Will continue to monitor.

## 2015-02-17 ENCOUNTER — Inpatient Hospital Stay (HOSPITAL_COMMUNITY): Payer: Medicare Other

## 2015-02-17 DIAGNOSIS — R55 Syncope and collapse: Secondary | ICD-10-CM

## 2015-02-17 DIAGNOSIS — I10 Essential (primary) hypertension: Secondary | ICD-10-CM

## 2015-02-17 LAB — BASIC METABOLIC PANEL
Anion gap: 5 (ref 5–15)
BUN: 6 mg/dL (ref 6–20)
CALCIUM: 8.7 mg/dL — AB (ref 8.9–10.3)
CO2: 23 mmol/L (ref 22–32)
Chloride: 115 mmol/L — ABNORMAL HIGH (ref 101–111)
Creatinine, Ser: 0.88 mg/dL (ref 0.44–1.00)
GFR calc Af Amer: >60 mL/min (ref >60)
GLUCOSE: 108 mg/dL — AB (ref 65–99)
POTASSIUM: 3.6 mmol/L (ref 3.5–5.1)
Sodium: 143 mmol/L (ref 135–145)

## 2015-02-17 LAB — CBC
HCT: 30.8 % — ABNORMAL LOW (ref 36.0–46.0)
Hemoglobin: 10.7 g/dL — ABNORMAL LOW (ref 12.0–15.0)
MCH: 31.3 pg (ref 26.0–34.0)
MCHC: 34.7 g/dL (ref 30.0–36.0)
MCV: 90.1 fL (ref 78.0–100.0)
Platelets: 120 10*3/uL — ABNORMAL LOW (ref 150–400)
RBC: 3.42 MIL/uL — ABNORMAL LOW (ref 3.87–5.11)
RDW: 13 % (ref 11.5–15.5)
WBC: 4.6 10*3/uL (ref 4.0–10.5)

## 2015-02-17 LAB — GLUCOSE, CAPILLARY
Glucose-Capillary: 71 mg/dL (ref 65–99)
Glucose-Capillary: 170 mg/dL — ABNORMAL HIGH (ref 65–99)
Glucose-Capillary: 213 mg/dL — ABNORMAL HIGH (ref 65–99)
Glucose-Capillary: 145 mg/dL — ABNORMAL HIGH (ref 65–99)

## 2015-02-17 LAB — HEMOGLOBIN A1C
Hgb A1c MFr Bld: 10.3 % — ABNORMAL HIGH (ref 4.8–5.6)
MEAN PLASMA GLUCOSE: 249 mg/dL

## 2015-02-17 LAB — CLOSTRIDIUM DIFFICILE BY PCR: CDIFFPCR: NEGATIVE

## 2015-02-17 MED ORDER — INSULIN ASPART 100 UNIT/ML ~~LOC~~ SOLN
4.0000 [IU] | Freq: Three times a day (TID) | SUBCUTANEOUS | Status: DC
  Administered 2015-02-17: 4 [IU] via SUBCUTANEOUS

## 2015-02-17 MED ORDER — CARVEDILOL 25 MG PO TABS
25.0000 mg | ORAL_TABLET | Freq: Two times a day (BID) | ORAL | Status: DC
  Administered 2015-02-18 (×2): 25 mg via ORAL
  Filled 2015-02-17 (×3): qty 1

## 2015-02-17 MED ORDER — PERFLUTREN LIPID MICROSPHERE
1.0000 mL | INTRAVENOUS | Status: AC | PRN
  Administered 2015-02-17: 2 mL via INTRAVENOUS
  Filled 2015-02-17: qty 10

## 2015-02-17 NOTE — Progress Notes (Addendum)
  Echocardiogram 2D Echocardiogram with Definity has been performed.  Cathie BeamsGREGORY, Avonne Berkery 02/17/2015, 2:36 PM

## 2015-02-17 NOTE — Progress Notes (Signed)
Talked to patient about physical therapy, patient wanted Outpatient physical Therapy; [facility] Vestibular [facility]; The rehab center will contact the patient at home for a start up date and time for therapy;B Shelba FlakeChandler RN,BSN,MHA [phone]

## 2015-02-17 NOTE — Progress Notes (Signed)
Inpatient Diabetes Program Recommendations  AACE/ADA: New Consensus Statement on Inpatient Glycemic Control (2013)  Target Ranges:  Prepandial:   less than 140 mg/dL      Peak postprandial:   less than 180 mg/dL (1-2 hours)      Critically ill patients:  140 - 180 mg/dL   Results for [name], [name] (MRN [id number]) as of 02/17/2015 08:20  Ref. Range 02/16/2015 06:26 02/16/2015 12:09 02/16/2015 16:53 02/16/2015 21:08 02/17/2015 05:49  Glucose-Capillary Latest Ref Range: 65-99 mg/dL 409155 (H) 811239 (H) 914242 (H) 200 (H) 71   Reason for Visit: Rectal bleeding/diarrhea/syncope  Diabetes history: DM 2 Outpatient Diabetes medications: 75/25 150-300 = 4 units, over 300 = 8 units TID, Levemir 8-10 units BID Current orders for Inpatient glycemic control: Levemir 10 units BID, Novolog 0-9 units TID  Inpatient Diabetes Program Recommendations  Insulin - Meal Coverage: Patient's glucose rises into the 200's around meal times. Patient takes meal coverage at home with her 75/25 insulin. Please consider Novolog 4 units TID for meal coverage in addition to correction scale.  Thanks,  Christena DeemShannon Dionisio Aragones RN, MSN, [facility] Inpatient Diabetes Coordinator Team Pager [phone]

## 2015-02-17 NOTE — Progress Notes (Signed)
Physical Therapy Treatment Patient Details Name: Katie RilesLinda K Martin MRN: [id number] DOB: [birth year] Today's Date: 02/17/2015    History of Present Illness [name] is a 69 y.o. female history of CAD status post CABG, diabetes mellitus type 2, hypertension, hyperlipidemia, chronic anemia, chronic kidney disease was brought to the ER after patient had a brief episode of loss of consciousness. Patient states that around 6 PM patient started feeling weak and unwell. Pt with GIB and syncope related to anemia and hypotension    PT Comments    Pt admitted with above diagnosis. Pt currently with functional limitations due to dizziness and balance deficits.  Pt treated for horizontal and posterior canal BPPV with some relief of symptoms.  Pt given handouts to show [facility].  HHPT should be able to f/u with pt and continue to treat BPPV prn.  Will follow acutely until d/c.   Pt will benefit from skilled PT to increase their independence and safety with mobility to allow discharge to the venue listed below.    Follow Up Recommendations  Home health PT (vestibular)     Equipment Recommendations  Rolling walker with 5" wheels    Recommendations for Other Services       Precautions / Restrictions Precautions Precautions: Fall Restrictions Weight Bearing Restrictions: No    Mobility  Bed Mobility Overal bed mobility: Modified Independent                Transfers Overall transfer level: Modified independent                  Ambulation/Gait Ambulation/Gait assistance: Supervision Ambulation Distance (Feet): 25 Feet Assistive device: Rolling walker (2 wheeled) Gait Pattern/deviations: Step-through pattern;Decreased stride length   Gait velocity interpretation: Below normal speed for age/gender General Gait Details: pt ambulates well with RW.    Stairs            Wheelchair Mobility    Modified Rankin (Stroke Patients Only)       Balance Overall balance  assessment: Needs assistance   Sitting balance-Leahy Scale: Good       Standing balance-Leahy Scale: Good                      Cognition Arousal/Alertness: Awake/alert Behavior During Therapy: WFL for tasks assessed/performed Overall Cognitive Status: Within Functional Limits for tasks assessed                      Exercises Other Exercises Other Exercises: Educated pt in [place] exercise    General Comments General comments (skin integrity, edema, etc.): Pt tested positive for right horizontal canal BPPV.  Treated with BBQ roll.  Pt still a little dizzy therefore tested pt for posterior canals and pt also positive for left posterior canal.  Treated with canalith repositioning maneuver.  Pt reported that her dizziness initially was 10/10 with movement and upon end of session it was 8/10.  Will attempt to reassess in am if pt still here.        Pertinent Vitals/Pain Pain Assessment: No/denies pain  VSS    Home Living                      Prior Function            PT Goals (current goals can now be found in the care plan section) Acute Rehab PT Goals Patient Stated Goal: return home Progress towards PT goals: Progressing toward  goals    Frequency  Min 3X/week    PT Plan Current plan remains appropriate    Co-evaluation             End of Session Equipment Utilized During Treatment: Gait belt Activity Tolerance: Patient limited by fatigue Patient left: in bed;with call bell/phone within reach;with family/visitor present;with bed alarm set     Time: 1610-9604 PT Time Calculation (min) (ACUTE ONLY): 21 min  Charges:  $Canalith Rep Proc: 8-22 mins                    G Codes:      [name] 05-Mar-2015, 4:10 PM  [employer] Acute Rehabilitation [phone] (pager)

## 2015-02-17 NOTE — Evaluation (Signed)
Physical Therapy Evaluation Patient Details Name: Katie RilesLinda K Martin MRN: [id number] DOB: [birth year] Today's Date: 02/17/2015   History of Present Illness  [name] is a 69 y.o. female history of CAD status post CABG, diabetes mellitus type 2, hypertension, hyperlipidemia, chronic anemia, chronic kidney disease was brought to the ER after patient had a brief episode of loss of consciousness. Patient states that around 6 PM patient started feeling weak and unwell. Pt with GIB and syncope related to anemia and hypotension  Clinical Impression  Pt very pleasant and up dressing at sink on arrival. Pt reports she has had periods of dizziness for the last year with lying back onto the bed and bending over. Pt with negative Dix Hallpike bilaterally but positive symptoms with right supine head roll no noted nystagmus. Pt educated for possible vestibular involvement and will benefit from additional assessment and treatment for vestibular dysfunction as well as acute therapy to address balance and gait deficits to decrease fall risk with recommendation for RW use at all times. Pt with above and below deficits and agreeable to therapy plan to improve function and gait.     Follow Up Recommendations Home health PT (vestibular)    Equipment Recommendations  Rolling walker with 5" wheels    Recommendations for Other Services       Precautions / Restrictions Precautions Precautions: Fall      Mobility  Bed Mobility Overal bed mobility: Modified Independent                Transfers Overall transfer level: Modified independent                  Ambulation/Gait Ambulation/Gait assistance: Supervision for safety Ambulation Distance (Feet): 400 Feet Assistive device: None Gait Pattern/deviations: Step-through pattern;Decreased stride length   Gait velocity interpretation: Below normal speed for age/gender General Gait Details: pt walked with slow controlled gait in open hallway  without difficulty, unable to change gait speed or perform head turns. pt walked 40' in room with RW end of session with improved trunk extension and ability to perform neck rotation with gait cues for RW use and safety  Stairs            Wheelchair Mobility    Modified Rankin (Stroke Patients Only)       Balance Overall balance assessment: Needs assistance   Sitting balance-Leahy Scale: Good       Standing balance-Leahy Scale: Good                               Pertinent Vitals/Pain Pain Assessment: No/denies pain    Home Living Family/patient expects to be discharged to:: Private residence Living Arrangements: Spouse/significant other Available Help at Discharge: Family;Available PRN/intermittently Type of Home: Mobile home Home Access: Ramped entrance     Home Layout: One level Home Equipment: None      Prior Function Level of Independence: Independent         Comments: pt normally able to function and care for herself in the home with family assisting for transportation and shopping. pt with prior neck surgery and limited rotation     Hand Dominance        Extremity/Trunk Assessment   Upper Extremity Assessment: Overall WFL for tasks assessed           Lower Extremity Assessment: Overall WFL for tasks assessed      Cervical / Trunk Assessment: Normal  Communication  Communication: No difficulties  Cognition Arousal/Alertness: Awake/alert Behavior During Therapy: WFL for tasks assessed/performed Overall Cognitive Status: Within Functional Limits for tasks assessed                      General Comments      Exercises        Assessment/Plan    PT Assessment Patient needs continued PT services  PT Diagnosis Difficulty walking   PT Problem List Decreased strength;Decreased activity tolerance;Decreased balance;Decreased knowledge of use of DME  PT Treatment Interventions Gait training;DME  instruction;Functional mobility training;Therapeutic activities;Patient/family education   PT Goals (Current goals can be found in the Care Plan section) Acute Rehab PT Goals Patient Stated Goal: return home PT Goal Formulation: With patient Time For Goal Achievement: 03/03/15 Potential to Achieve Goals: Good    Frequency Min 3X/week   Barriers to discharge Decreased caregiver support      Co-evaluation               End of Session   Activity Tolerance: Patient tolerated treatment well Patient left: in chair;with call bell/phone within reach;with chair alarm set Nurse Communication: Mobility status         Time: 1610-9604 PT Time Calculation (min) (ACUTE ONLY): 24 min   Charges:   PT Evaluation $Initial PT Evaluation Tier I: 1 Procedure PT Treatments $Therapeutic Activity: 8-22 mins   PT G CodesDelorse Lek 02/17/2015, 10:49 AM Delaney Meigs, PT [phone]

## 2015-02-17 NOTE — Progress Notes (Addendum)
[facility]                                                                              Patient Demographics  [name], is a 69 y.o. female, DOB - September 25, 1946, [id number]  Admit date - 02/15/2015   Admitting Physician Eduard ClosArshad N Kakrakandy, MD  Outpatient Primary MD for the patient is Pcp Not In System  LOS - 2   Chief Complaint  Patient presents with  . Loss of Consciousness      HPI on 02/16/2015 by Dr. Midge MiniumArshad Kakrakandy  [name] is a 69 y.o. female history of CAD status post CABG, diabetes mellitus type 2, hypertension, hyperlipidemia, chronic anemia, chronic kidney disease was brought to the ER after patient had a brief episode of loss of consciousness. Patient states that around 6 PM patient started feeling weak and unwell. Patient started having diarrhea and after 3 episodes when patient was trying to stand up from the commode patient felt dizzy and lost consciousness and the next thing patient remembers was patient was on the floor. Patient did not have any incontinence of urine or tongue bite. Patient states she may have lost consciousness for less than a minute. Patient was brought to the ER and in the ER patient was found to be orthostatic and was given 1.5 L fluid bolus. In the ER patient continued to have diarrhea and was found to be having blood clots in the diarrhea. Patient has been also having crampy abdominal pain since the diarrhea started. Denies any recent travel or any sick contacts. Denies any recent use of antibiotics or hospitalization. Patient is admitted for further management of her syncope and rectal bleeding. Denies any chest pain shortness of breath or palpitations.   Assessment & Plan   Rectal bleeding with diarrhea/abdominal discomfort -CT abdomen pelvis: No acute findings in the abdomen or pelvis  -Patient denies further diarrhea or bleeding -Diarrhea possibly secondary to gastroenteritis -Cdiff negative -Currently afebrile, no  leukocytosis -FOBT positive -Hemoglobin stable, currently 10.7 -Spoke with GI: recommended Anusol suppositories, outpatient follow up (03/03/2015 at 8:45am)  Syncope -Likely secondary to volume depletion from diarrhea/hypotension -BP low normal -antihypertensives held -Orthostatics positive, will repeat today as patient has received hydration -Echocardiogram pending -PT recommended home health vestibular PT  History of CAD s/p CABG -Denies chest pain -Aspirin held due to rectal bleeding  Diabetes mellitus, type 2 -Hemoglobin A1c 10.3 -Continue ISS and CBG monitoring -Diabetes coordinator consulted- rec novolog 4u TID  Chronic kidney disease, stage 3 -?baseline Cr 1.3 -Was 1.56 upon admission, currently 0.88 -Continue to monitor   Hypertension -Resolved,  -Low to normal, continue to monitor, meds held  Code Status: SCDs  Family Communication: None at bedside  Disposition Plan: Admitted. Likely discharge within the next 24-48hrs. Echo and PT pending  Time Spent in minutes 30 minutes  Procedures  Echocardiogram  Consults  Gastroenterology, via phone  DVT Prophylaxis SCDs   Lab Results  Component Value Date   PLT 120* 02/17/2015    Medications  Scheduled Meds: . carvedilol  25 mg Oral BID WC  . gabapentin  600 mg Oral TID  . insulin aspart  0-9 Units Subcutaneous TID WC  .  insulin aspart  4 Units Subcutaneous TID WC  . insulin detemir  10 Units Subcutaneous BID  . pantoprazole  40 mg Oral Daily  . simvastatin  20 mg Oral Daily   Continuous Infusions:  PRN Meds:.acetaminophen **OR** acetaminophen, ondansetron **OR** ondansetron (ZOFRAN) IV  Antibiotics    Anti-infectives    None     Subjective:   Bonita Quin Garske seen and examined today. Patient denies blood per rectum, or diarrhea. She finally had a bowel movement this morning. Denies any chest pain, shortness of breath, palpitations, abdominal pain. Patient states she has not been able to get  up and walk. Patient does inquire about when she will go home.  Objective:   Filed Vitals:   02/16/15 1632 02/16/15 1746 02/16/15 2100 02/17/15 0636  BP:  124/80 103/56 141/54  Pulse:  70 84 64  Temp: 97.8 F (36.6 C)  98 F (36.7 C) 98.5 F (36.9 C)  TempSrc: Oral  Oral Oral  Resp:   20 20  Height:      Weight:    75.1 kg (165 lb 9.1 oz)  SpO2:   92% 99%    Wt Readings from Last 3 Encounters:  02/17/15 75.1 kg (165 lb 9.1 oz)  12/12/14 72.576 kg (160 lb)     Intake/Output Summary (Last 24 hours) at 02/17/15 1303 Last data filed at 02/17/15 0900  Gross per 24 hour  Intake 2743.33 ml  Output   1401 ml  Net 1342.33 ml    Exam  General: Well developed, well nourished, NAD, appears stated age  HEENT: NCAT,  mucous membranes moist.   Cardiovascular: S1 S2 auscultated, no rubs, murmurs or gallops. Regular rate and rhythm.  Respiratory: Clear to auscultation bilaterally with equal chest rise  Abdomen: Soft, nontender, nondistended, + bowel sounds  Extremities: warm dry without cyanosis clubbing or edema  Neuro: AAOx3, nonfocal  Psych: Normal affect and demeanor     Data Review   Micro Results Recent Results (from the past 240 hour(s))  Clostridium Difficile by PCR     Status: None   Collection Time: 02/17/15  9:24 AM  Result Value Ref Range Status   C difficile by pcr NEGATIVE NEGATIVE Final    Radiology Reports Ct Abdomen Pelvis Wo Contrast  02/16/2015   CLINICAL DATA:  Abdominal pain, nausea and diarrhea.  EXAM: CT ABDOMEN AND PELVIS WITHOUT CONTRAST  TECHNIQUE: Multidetector CT imaging of the abdomen and pelvis was performed following the standard protocol without IV contrast.  COMPARISON:  Abdominal series on 05/07/2006  FINDINGS: Unenhanced appearance of the liver, spleen, kidneys and adrenal glands are unremarkable. The pancreas is atrophic. The gallbladder has been removed. No biliary ductal dilatation is identified.  Bowel shows no evidence of  obstruction or inflammation. No free fluid or abscess is identified. Atherosclerotic calcifications are noted involving the aorta as well as multiple other arteries without evidence of aneurysmal disease.  No masses or enlarged lymph nodes are seen. The bladder appears unremarkable. The uterus has been removed. Bony structures show osteopenia and degenerative disc disease at L5-S1. The visualized lung bases are unremarkable.  IMPRESSION: No acute findings in the abdomen or pelvis.   Electronically Signed   By: Irish Lack M.D.   On: 02/16/2015 07:34    CBC  Recent Labs Lab 02/15/15 2015 02/16/15 0212 02/16/15 0540 02/16/15 1015 02/17/15 0317  WBC 9.4 6.2 6.2 6.3 4.6  HGB 12.7 11.3* 11.5* 11.6* 10.7*  HCT 36.2 32.2* 33.6* 33.0* 30.8*  PLT  130* 130* 129* 173 120*  MCV 89.4 89.7 89.1 89.4 90.1  MCH 31.4 31.5 30.5 31.4 31.3  MCHC 35.1 35.1 34.2 35.2 34.7  RDW 12.7 12.8 12.7 12.8 13.0    Chemistries   Recent Labs Lab 02/15/15 2015 02/16/15 0540 02/17/15 0317  NA 135 140 143  K 4.4 4.1 3.6  CL 104 109 115*  CO2 GLUCOSE 364* 186* 108*  BUN CREATININE 1.56* 1.28* 0.88  CALCIUM 9.0 8.7* 8.7*  AST 23 20  --   ALT 16 14  --   ALKPHOS 41 36*  --   BILITOT 0.7 0.6  --    ------------------------------------------------------------------------------------------------------------------ estimated creatinine clearance is 59.7 mL/min (by C-G formula based on Cr of 0.88). ------------------------------------------------------------------------------------------------------------------  Recent Labs  02/16/15 0212  HGBA1C 10.3*   ------------------------------------------------------------------------------------------------------------------ No results for input(s): CHOL, HDL, LDLCALC, TRIG, CHOLHDL, LDLDIRECT in the last 72 hours. ------------------------------------------------------------------------------------------------------------------ No results for  input(s): TSH, T4TOTAL, T3FREE, THYROIDAB in the last 72 hours.  Invalid input(s): FREET3 ------------------------------------------------------------------------------------------------------------------ No results for input(s): VITAMINB12, FOLATE, FERRITIN, TIBC, IRON, RETICCTPCT in the last 72 hours.  Coagulation profile  Recent Labs Lab 02/15/15 2339  INR 1.20    No results for input(s): DDIMER in the last 72 hours.  Cardiac Enzymes  Recent Labs Lab 02/15/15 2015 02/16/15 0212  TROPONINI <0.03 <0.03   ------------------------------------------------------------------------------------------------------------------ Invalid input(s): POCBNP    Xochil Shanker D.O. on 02/17/2015 at 1:03 PM  Between 7am to 7pm - Pager - [phone]  After 7pm go to [email or web address] - password [email or web address]  And look for the night coverage person covering for me after hours  [facility] Office  [phone]

## 2015-02-18 DIAGNOSIS — K625 Hemorrhage of anus and rectum: Secondary | ICD-10-CM

## 2015-02-18 LAB — GLUCOSE, CAPILLARY
Glucose-Capillary: 224 mg/dL — ABNORMAL HIGH (ref 65–99)
Glucose-Capillary: 272 mg/dL — ABNORMAL HIGH (ref 65–99)
Glucose-Capillary: 187 mg/dL — ABNORMAL HIGH (ref 65–99)

## 2015-02-18 LAB — BASIC METABOLIC PANEL
Anion gap: 9 (ref 5–15)
BUN: 9 mg/dL (ref 6–20)
CHLORIDE: 111 mmol/L (ref 101–111)
CO2: 23 mmol/L (ref 22–32)
Calcium: 9 mg/dL (ref 8.9–10.3)
Creatinine, Ser: 1.19 mg/dL — ABNORMAL HIGH (ref 0.44–1.00)
GFR calc Af Amer: 54 mL/min — ABNORMAL LOW (ref >60)
GFR calc non Af Amer: 47 mL/min — ABNORMAL LOW (ref >60)
Glucose, Bld: 170 mg/dL — ABNORMAL HIGH (ref 65–99)
Potassium: 3.8 mmol/L (ref 3.5–5.1)
SODIUM: 143 mmol/L (ref 135–145)

## 2015-02-18 LAB — CBC
HEMATOCRIT: 32.4 % — AB (ref 36.0–46.0)
Hemoglobin: 11 g/dL — ABNORMAL LOW (ref 12.0–15.0)
MCH: 30.9 pg (ref 26.0–34.0)
MCHC: 34 g/dL (ref 30.0–36.0)
MCV: 91 fL (ref 78.0–100.0)
Platelets: 134 10*3/uL — ABNORMAL LOW (ref 150–400)
RBC: 3.56 MIL/uL — ABNORMAL LOW (ref 3.87–5.11)
RDW: 13.1 % (ref 11.5–15.5)
WBC: 5.6 10*3/uL (ref 4.0–10.5)

## 2015-02-18 MED ORDER — GABAPENTIN 600 MG PO TABS: 600.0000 mg | ORAL_TABLET | Freq: Three times a day (TID) | ORAL | Status: AC

## 2015-02-18 MED ORDER — INSULIN ASPART 100 UNIT/ML ~~LOC~~ SOLN
0.0000 [IU] | Freq: Three times a day (TID) | SUBCUTANEOUS | Status: DC
  Administered 2015-02-18: 5 [IU] via SUBCUTANEOUS
  Administered 2015-02-18: 3 [IU] via SUBCUTANEOUS
  Administered 2015-02-18: 2 [IU] via SUBCUTANEOUS

## 2015-02-18 MED ORDER — CARVEDILOL 25 MG PO TABS: 12.5000 mg | ORAL_TABLET | Freq: Two times a day (BID) | ORAL | Status: AC

## 2015-02-18 MED ORDER — INSULIN ASPART 100 UNIT/ML ~~LOC~~ SOLN
4.0000 [IU] | Freq: Three times a day (TID) | SUBCUTANEOUS | Status: DC
  Administered 2015-02-18 (×3): 4 [IU] via SUBCUTANEOUS

## 2015-02-18 NOTE — Progress Notes (Signed)
Physical Therapy Treatment Patient Details Name: [name] MRN: [id number] DOB: [birth year] Today's Date: 02/18/2015    History of Present Illness [name] is a 69 y.o. female history of CAD status post CABG, diabetes mellitus type 2, hypertension, hyperlipidemia, chronic anemia, chronic kidney disease was brought to the ER after patient had a brief episode of loss of consciousness. Patient states that around 6 PM patient started feeling weak and unwell. Pt with GIB and syncope related to anemia and hypotension    PT Comments    Pt admitted with above diagnosis. Pt currently with functional limitations due to dizziness and balance deficits. Pt with decr symptoms of dizziness as well as improved balance after treatment yesterday per pt on arrival.  Pt able to perform ADL at sink with supervision and ambulate with supervision.   Pt needs HHOT to assess bath equipment and HHPT safety eval as well as vestibular treatment.  Can transition to Outpt PT when ready.   Pt will benefit from skilled PT to increase their independence and safety with mobility to allow discharge to the venue listed below.    Follow Up Recommendations  Home health PT (vestibular, safety eval,  will need to transition to Outpt )     Equipment Recommendations  Rolling walker with 5" wheels    Recommendations for Other Services       Precautions / Restrictions Precautions Precautions: Fall Restrictions Weight Bearing Restrictions: No    Mobility  Bed Mobility                  Transfers Overall transfer level: Modified independent               General transfer comment: On arrival pt standing at sink finishing bath with NT.  Observed pt finish bath with pt able to wash all body parts sitting.  Took socks off as well.  Able to stand and wash perineal area by herself.  Put her deodorant on as well as brushed her teeth and hair.    Ambulation/Gait Ambulation/Gait assistance:  Supervision Ambulation Distance (Feet): 200 Feet Assistive device: Rolling walker (2 wheeled) Gait Pattern/deviations: Step-through pattern;Decreased stride length   Gait velocity interpretation: Below normal speed for age/gender General Gait Details: pt ambulates well with RW. can withstand even min challenges to balance.     Stairs            Wheelchair Mobility    Modified Rankin (Stroke Patients Only)       Balance Overall balance assessment: Needs assistance   Sitting balance-Leahy Scale: Good     Standing balance support: Single extremity supported;During functional activity;No upper extremity supported Standing balance-Leahy Scale: Fair Standing balance comment: Pt stands statically without assist.              High level balance activites: Direction changes;Turns;Head turns High Level Balance Comments: all of above with RW with min guard assist.  No activie LOB with these challenges.      Cognition Arousal/Alertness: Awake/alert Behavior During Therapy: WFL for tasks assessed/performed Overall Cognitive Status: Within Functional Limits for tasks assessed                      Exercises Other Exercises Other Exercises: Reviewed pt with Austin Miles exercise    General Comments General comments (skin integrity, edema, etc.): Dizziness initially 8/10.  Pt tested positive for left anterior and left posterior canal therefore performed canalith repositioning bil.  Checked horizontal canal and  tested questionable for right horizontal canal again therefore repeated BBQ roll.  Dizziness at end of treatment 6/10.  Improvement with each treatment.        Pertinent Vitals/Pain Pain Assessment: No/denies pain  VSS    Home Living                      Prior Function            PT Goals (current goals can now be found in the care plan section) Progress towards PT goals: Progressing toward goals    Frequency  Min 3X/week    PT Plan  Discharge plan needs to be updated    Co-evaluation             End of Session Equipment Utilized During Treatment: Gait belt Activity Tolerance: Patient tolerated treatment well Patient left: in chair;with call bell/phone within reach     Time: [id number] PT Time Calculation (min) (ACUTE ONLY): 48 min  Charges:  $Self Care/Home Management: 8-22 $Canalith Rep Proc: 8-22 mins $Physical Performance Test: 8-22 mins                    G Codes:      Tawni MillersWhite, Sheamus Hasting F 02/18/2015, 1:31 PM [employer] CorporationDawn Mashonda Broski,PT Acute Rehabilitation [phone] (pager)

## 2015-02-18 NOTE — Progress Notes (Signed)
D/C IV, D/C Tele, D/C instructions reviewed with pt. And paperwork given to pt. Pt. Showed no signs or symptoms of distress, pt. Left unit via wheelchair, transported home by sister.

## 2015-02-18 NOTE — Discharge Summary (Signed)
Physician Discharge Summary  [name] ZOX:[id number] DOB: Oct 19, 1945 DOA: 02/15/2015  PCP: Pcp Not In System  Admit date: 02/15/2015 Discharge date: 02/18/2015  Time spent: 35 minutes  Recommendations for Outpatient Follow-up:  1. Recommended getting a complete blood count as well as basic metabolic panel in about 1 week's time at primary care physician's office 2. Therapy evaluation for vestibular rehabilitation pending at time of this discharge-may need outpatient vestibular training 3. Medication changes this admission  Discontinue ramipril  Dosage of Coreg change from 25 twice a day to 12.5 twice a day secondary to orthostatic hypotension 4. Therapy recommended home health PT versus rolling walker with 5 inch wheels 5. 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  Echocardiogram performed this admission was nonspecific but showed an EF of 45-50% and will need follow-up as an outpatient  Patient was instructed regarding the need for potential outpatient orthostatic help and she should resume cautiously some of her medications.   Consultations:   Gastroenterology Dr. Madilyn Fireman was telephone consulted by Dr. Catha Gosselin and an appointment was set up as an outpatient 03/03/15  Discharge Exam: Filed Vitals:   02/18/15 0900  BP: 109/84  Pulse: 76  Temp: 97.5 F (36.4 C)  Resp:     General:  Alert pleasant oriented somewhat anxious Tells me she's been little woozy headed since one week ago States that no new medication changes have occurred [place] and hit her head X2 on day of admission Still feels a little lightheaded but is more stable with a walker Ambulated around the room with me without any specific issue Cardiovascular:  S1-S2 no murmur rub or gallop Respiratory:  Clinically clear no lower extremity edema   Discharge Instructions   Discharge Instructions    Diet - low sodium heart healthy    Complete by:  As directed      Discharge instructions    Complete by:  As directed   Please cut back on the dose of your Coreg from 25-12.5 twice a day Do not take your ramipril until you see your primary care physician or cardiologist-we believe that her blood pressure dropping may be a part of why you were dizzy but we also think that he might have an inner ear problem and therapy may be needed as an outpatient to help you with this  issue  Please get labs in about 1-2 weeks from your primary care physician to ensure things are still going well Please follow-up with your gastroenterologist at the appointment that has been scheduled for you     Increase activity slowly    Complete by:  As directed           Current Discharge Medication List    CONTINUE these medications which have CHANGED   Details  carvedilol (COREG) 25 MG tablet Take  0.5 tablets (12.5 mg total) by mouth 2 (two) times daily with a meal.    gabapentin (NEURONTIN) 600 MG tablet Take 1 tablet (600 mg total) by mouth 3 (three) times daily. Qty: 60 tablet, Refills: 0      CONTINUE these medications which have NOT CHANGED   Details  insulin lispro protamine-lispro (HUMALOG 75/25 MIX) (75-25) 100 UNIT/ML SUSP injection Inject 4-8 Units into the skin 3 (three) times daily. 150-300 = 4 units, over 300 = 8 units    LEVEMIR FLEXTOUCH 100 UNIT/ML Pen Inject 8-10 Units into the skin 2 (two) times daily. Refills: 1    meclizine (ANTIVERT) 25 MG tablet Take 25 mg by mouth daily as needed for dizziness.  Refills: 1    omeprazole (PRILOSEC) 20 MG capsule Take 20 mg by mouth daily.    simvastatin (ZOCOR) 20 MG tablet Take 20 mg by mouth daily.      STOP taking these medications     aspirin 81 MG tablet      ramipril (ALTACE) 5 MG capsule      sulfamethoxazole-trimethoprim (SEPTRA DS) 800-160 MG per tablet        Allergies  Allergen Reactions  . Phenergan [Promethazine Hcl] Nausea And Vomiting   Follow-up Information    [facility]   Specialty:  Rehabilitation   Why:  they will contact you at home for a start up date and time for your therapy   Contact information:   [address] [id number] mc [place] [address] [phone]      Follow up with Hvozdovic, Tollie Pizza, PA-C. Go on 03/03/2015.   Specialty:  Physician Assistant   Contact information:   [address] [place] [address] [phone]        The results of significant diagnostics from this hospitalization (including imaging, microbiology, ancillary and laboratory) are listed below for reference.    Significant Diagnostic Studies: Ct Abdomen Pelvis Wo Contrast  02/16/2015   CLINICAL DATA:  Abdominal pain, nausea and diarrhea.  EXAM: CT ABDOMEN AND PELVIS WITHOUT CONTRAST  TECHNIQUE: Multidetector CT  imaging of the abdomen and pelvis was performed following the standard protocol without IV contrast.  COMPARISON:  Abdominal series on 05/07/2006  FINDINGS: Unenhanced appearance of the liver, spleen, kidneys and adrenal glands are unremarkable. The pancreas is atrophic. The gallbladder has been removed. No biliary ductal dilatation is identified.  Bowel shows no evidence of obstruction or inflammation. No free fluid or abscess is identified. Atherosclerotic calcifications are noted involving the aorta as well as multiple other arteries without evidence of aneurysmal disease.  No masses or enlarged lymph nodes are seen. The bladder appears unremarkable. The uterus has been removed. Bony structures show osteopenia and degenerative disc disease at L5-S1. The visualized lung bases are unremarkable.  IMPRESSION: No acute findings in the abdomen or pelvis.   Electronically Signed   By: Irish Lack M.D.   On: 02/16/2015 07:34    Microbiology: Recent Results (  from the past 240 hour(s))  Clostridium Difficile by PCR     Status: None   Collection Time: 02/17/15  9:24 AM  Result Value Ref Range Status   C difficile by pcr NEGATIVE NEGATIVE Final     Labs: Basic Metabolic Panel:  Recent Labs Lab 02/15/15 2015 02/16/15 0540 02/17/15 0317 02/18/15 0405  NA 135 140 143 143  K 4.4 4.1 3.6 3.8  CL 104 109 115* 111  CO2 23 25 23 23   GLUCOSE 364* 186* 108* 170*  BUN 18 15 6 9   CREATININE 1.56* 1.28* 0.88 1.19*  CALCIUM 9.0 8.7* 8.7* 9.0   Liver Function Tests:  Recent Labs Lab 02/15/15 2015 02/16/15 0540  AST 23 20  ALT 16 14  ALKPHOS 41 36*  BILITOT 0.7 0.6  PROT 6.0* 5.5*  ALBUMIN 3.3* 3.1*   No results for input(s): LIPASE, AMYLASE in the last 168 hours. No results for input(s): AMMONIA in the last 168 hours. CBC:  Recent Labs Lab 02/16/15 0212 02/16/15 0540 02/16/15 1015 02/17/15 0317 02/18/15 0405  WBC 6.2 6.2 6.3 4.6 5.6  HGB 11.3* 11.5* 11.6* 10.7* 11.0*  HCT 32.2*  33.6* 33.0* 30.8* 32.4*  MCV 89.7 89.1 89.4 90.1 91.0  PLT 130* 129* 173 120* 134*   Cardiac Enzymes:  Recent Labs Lab 02/15/15 2015 02/16/15 0212  TROPONINI <0.03 <0.03   BNP: BNP (last 3 results) No results for input(s): BNP in the last 8760 hours.  ProBNP (last 3 results) No results for input(s): PROBNP in the last 8760 hours.  CBG:  Recent Labs Lab 02/17/15 0549 02/17/15 1147 02/17/15 1621 02/17/15 2058 02/18/15 0621  GLUCAP 71 170* 213* 145* 224*       Signed:  Rhetta MuraSAMTANI, JAI-GURMUKH  [facility] 02/18/2015, 10:25 AM

## 2015-02-18 NOTE — Progress Notes (Signed)
Patient decided that she wants HHPT instead of Outpatient PT;[facility]HC choices offered, [facility] for [facility]; Alexis GoodellB Carlynn Leduc RN,BSN,MHA [phone]

## 2015-02-18 NOTE — Consult Note (Signed)
   [name] CM Inpatient Consult   02/18/2015  [name] 1946-06-26 [id number] Patient evaluated for community based chronic disease management services with [name] as a benefit of patient's Medicare Insurance. Met with patient at bedside to explain [name] services. Consent form signed. Patient confirmed that her primary MD is Dr. Glendale Chard.  Patient will receive post discharge transition of care call and will be evaluated for monthly home visits for assessments and disease process education.  Left contact information and [facility] literature at bedside. Made Inpatient Case Manager aware that [facility] following. Of note, [facility] Care Management services does not replace or interfere with any services that are arranged by inpatient case management or social work.  For additional questions or referrals please contact:   Natividad Brood, RN BSN [facility] Liaison  [phone] business mobile phone

## 2015-02-20 ENCOUNTER — Other Ambulatory Visit: Payer: Self-pay

## 2015-02-20 NOTE — Patient Outreach (Signed)
Transition of care outreach:  Attempted to reach patient. No answer. Left a message requesting a call back.  Rowe PavyAmanda Nishant Schrecengost, RN, BSN, CEN [facility] Care Coordinator [phone]

## 2015-02-20 NOTE — Patient Instructions (Signed)
4

## 2015-02-20 NOTE — Patient Outreach (Signed)
[facility] Care Management  02/20/2015  Katie ScarletLinda K Martin 07-29-1946 [id number]   Request from Charlesetta ShanksVictoria Brewer, RN for [employer] outreach, assigned Katie PavyAmanda Cook, RN, [name], PharmD, [place], [place].  Katie MckusickLisa O. Sharlee Martin, AABA [facility] Care Management [facility] CM Assistant Phone: [phone] Fax: [phone]

## 2015-02-20 NOTE — Patient Outreach (Signed)
Transition of care:  Placed 2 calls to patient. Unsuccessful.  Plan: Will attempt again on next business day (02/24/2015)  Rowe PavyAmanda Nishika Parkhurst, RN, BSN, CEN [facility] Care Coordinator [phone]

## 2015-02-20 NOTE — Patient Outreach (Signed)
Transition of care attempt:  Placed call to sister who is on patients consent form. No answer.  Plan: Will continue to attempt outreach for transition of care.  Rowe PavyAmanda Cook, RN, BSN, CEN [facility] Care Coordinator [phone]

## 2015-02-21 LAB — GI PATHOGEN PANEL BY PCR, STOOL
C difficile toxin A/B: NOT DETECTED
CRYPTOSPORIDIUM BY PCR: NOT DETECTED
Campylobacter by PCR: NOT DETECTED
E coli (ETEC) LT/ST: NOT DETECTED
E coli (STEC): NOT DETECTED
E coli 0157 by PCR: NOT DETECTED
G LAMBLIA BY PCR: NOT DETECTED
Norovirus GI/GII: NOT DETECTED
Rotavirus A by PCR: NOT DETECTED
Salmonella by PCR: NOT DETECTED
Shigella by PCR: NOT DETECTED

## 2015-02-24 ENCOUNTER — Other Ambulatory Visit: Payer: Self-pay | Admitting: Pharmacist

## 2015-02-24 ENCOUNTER — Other Ambulatory Visit: Payer: Self-pay

## 2015-02-24 NOTE — Patient Outreach (Signed)
I called the patient to discuss medication management and assistnace and I had to leave a HIPPA compliant message for patient to return my phone call.  I will reach out on 02/25/15 if the patient does not return my call today.  Juanita CraverStacey Karl, PharmD, BCPS [facility] Pharmacy Resident [phone]

## 2015-02-24 NOTE — Patient Outreach (Signed)
2nd call for transition of care.   Left a message requesting a call back. Will await a call back. Will Higher education careers advisermail outreach letter.  Rowe PavyAmanda Doriann Zuch, RN, BSN, CEN [facility] [employer] [employer] Care Coordinator [phone]

## 2015-03-02 ENCOUNTER — Other Ambulatory Visit: Payer: Self-pay | Admitting: Pharmacist

## 2015-03-02 ENCOUNTER — Other Ambulatory Visit: Payer: Self-pay | Admitting: *Deleted

## 2015-03-02 ENCOUNTER — Other Ambulatory Visit: Payer: Self-pay

## 2015-03-02 NOTE — Patient Outreach (Signed)
3rd attempt to reach patient for transition of care call. Unsuccessful.  I have mail a letter to patient to attempt to get a response.  Plan:  Will attempt one more time in 1 week. If no response will plan to close case.   Rowe PavyAmanda Fran Neiswonger, RN, BSN, CEN [facility] Care Coordinator [phone]

## 2015-03-02 NOTE — Patient Outreach (Signed)
[facility] Care Management  03/02/2015  Katie ScarletLinda K Martin Dec 28, 1945 [id number]   Phone call to patient to assess for social work needs.  Per patient, sh recently discharged from the hospital and since that time has fallen several times.  Per patient, she feels lightheaded and blacks out.  Per patient, her family support is limited.  Her husband is hearing imparied and is not abe to hear her when she falls. Patient has one daughter, who also has medical issues and cannot help out consistently.  Per patient, she has difficulty standing for long periods of time and cannot make meals.  Patient requesting referral for meals on wheels.   Patient was not sure when her next appointment was with [facility] to address her light headedness and blacking out with her doctor.  Phone call made to doctor's office,  appointment for 03/11/15 at 11:00am previously scheduled confirmed.    Referral to meals on wheels funded by [facility] to be completed.  Home visit scheduled for 03/12/15 at 10:30am to continue to assess for social work needs.   [name], LCSW [facility] Care Management [phone]

## 2015-03-02 NOTE — Patient Outreach (Signed)
I called the patient to discuss medication management and assistnace and I had to leave a HIPAA compliant message for patient to return my phone call.  I will reach out on 03/05/15 if the patient does not return my call today.  Katie CraverStacey Karl, PharmD, BCPS [facility] Pharmacy Resident [phone]

## 2015-03-03 ENCOUNTER — Ambulatory Visit: Payer: Self-pay | Admitting: Physician Assistant

## 2015-03-03 ENCOUNTER — Encounter (HOSPITAL_COMMUNITY): Payer: Self-pay | Admitting: Emergency Medicine

## 2015-03-03 ENCOUNTER — Other Ambulatory Visit: Payer: Self-pay | Admitting: *Deleted

## 2015-03-03 ENCOUNTER — Emergency Department (HOSPITAL_COMMUNITY)
Admission: EM | Admit: 2015-03-03 | Discharge: 2015-03-03 | Disposition: A | Discharge status: Home / Self Care | Payer: Medicare Other | Attending: Emergency Medicine | Admitting: Emergency Medicine

## 2015-03-03 DIAGNOSIS — I2510 Atherosclerotic heart disease of native coronary artery without angina pectoris: Secondary | ICD-10-CM | POA: Diagnosis not present

## 2015-03-03 DIAGNOSIS — R42 Dizziness and giddiness: Secondary | ICD-10-CM | POA: Diagnosis not present

## 2015-03-03 DIAGNOSIS — R55 Syncope and collapse: Secondary | ICD-10-CM | POA: Diagnosis not present

## 2015-03-03 DIAGNOSIS — Z794 Long term (current) use of insulin: Secondary | ICD-10-CM | POA: Insufficient documentation

## 2015-03-03 DIAGNOSIS — R011 Cardiac murmur, unspecified: Secondary | ICD-10-CM | POA: Insufficient documentation

## 2015-03-03 DIAGNOSIS — R5383 Other fatigue: Secondary | ICD-10-CM | POA: Insufficient documentation

## 2015-03-03 DIAGNOSIS — E119 Type 2 diabetes mellitus without complications: Secondary | ICD-10-CM | POA: Insufficient documentation

## 2015-03-03 DIAGNOSIS — I10 Essential (primary) hypertension: Secondary | ICD-10-CM | POA: Insufficient documentation

## 2015-03-03 DIAGNOSIS — Z79899 Other long term (current) drug therapy: Secondary | ICD-10-CM | POA: Diagnosis not present

## 2015-03-03 LAB — BASIC METABOLIC PANEL
Anion gap: 7 (ref 5–15)
BUN: 14 mg/dL (ref 6–20)
CHLORIDE: 109 mmol/L (ref 101–111)
CO2: 25 mmol/L (ref 22–32)
Calcium: 8.7 mg/dL — ABNORMAL LOW (ref 8.9–10.3)
Creatinine, Ser: 1.13 mg/dL — ABNORMAL HIGH (ref 0.44–1.00)
GFR calc non Af Amer: 50 mL/min — ABNORMAL LOW (ref >60)
GFR, EST AFRICAN AMERICAN: 57 mL/min — AB (ref >60)
GLUCOSE: 210 mg/dL — AB (ref 65–99)
Potassium: 3.4 mmol/L — ABNORMAL LOW (ref 3.5–5.1)
SODIUM: 141 mmol/L (ref 135–145)

## 2015-03-03 LAB — CBC WITH DIFFERENTIAL/PLATELET
Basophils Absolute: 0 10*3/uL (ref 0.0–0.1)
Basophils Relative: 0 % (ref 0–1)
Eosinophils Absolute: 0.1 10*3/uL (ref 0.0–0.7)
Eosinophils Relative: 1 % (ref 0–5)
HEMATOCRIT: 31.4 % — AB (ref 36.0–46.0)
Hemoglobin: 10.8 g/dL — ABNORMAL LOW (ref 12.0–15.0)
LYMPHS PCT: 26 % (ref 12–46)
Lymphs Abs: 1.3 10*3/uL (ref 0.7–4.0)
MCH: 30.8 pg (ref 26.0–34.0)
MCHC: 34.4 g/dL (ref 30.0–36.0)
MCV: 89.5 fL (ref 78.0–100.0)
Monocytes Absolute: 0.4 10*3/uL (ref 0.1–1.0)
Monocytes Relative: 7 % (ref 3–12)
Neutro Abs: 3.3 10*3/uL (ref 1.7–7.7)
Neutrophils Relative %: 66 % (ref 43–77)
Platelets: 201 10*3/uL (ref 150–400)
RBC: 3.51 MIL/uL — ABNORMAL LOW (ref 3.87–5.11)
RDW: 13.2 % (ref 11.5–15.5)
WBC: 5 10*3/uL (ref 4.0–10.5)

## 2015-03-03 LAB — I-STAT TROPONIN, ED: Troponin i, poc: 0 ng/mL (ref 0.00–0.08)

## 2015-03-03 MED ORDER — SODIUM CHLORIDE 0.9 % IV BOLUS (SEPSIS)
1000.0000 mL | Freq: Once | INTRAVENOUS | Status: AC
  Administered 2015-03-03: 1000 mL via INTRAVENOUS

## 2015-03-03 NOTE — ED Notes (Signed)
Attempted to ambulate pt but after a few steps pt c/o dizzy and became unsteady on her feet and began to fall to the side. This RN assisted pt back the bed and notified the PA.

## 2015-03-03 NOTE — ED Notes (Signed)
EMS - Patient coming from home when Physical Therapy called EMS for near syncopal episode.  Diagnosed with vertigo 1 month ago.  Increased dizziness since Friday.  Vitals with PT 100/54 sitting and 60/54 standing.  PTAR 118/70 sitting and 92/60 standing.  Given 400cc of fluid.

## 2015-03-03 NOTE — Discharge Instructions (Signed)
return here as needed.  Follow-up with your primary care doctor as soon as possible

## 2015-03-03 NOTE — ED Provider Notes (Signed)
CSN: [id number]     Arrival date & time 03/03/15  1407 History   First MD Initiated Contact with Patient 03/03/15 1420     Chief Complaint  Patient presents with  . Near Syncope     (Consider location/radiation/quality/duration/timing/severity/associated sxs/prior Treatment) HPI Comments: Patient with history of MI, congestive heart failure, diabetes -- presents with complaint of near-syncope. Patient awoke this morning in her normal state of health. She took her medications including insulin. Patient began feeling tired and weak so she laid down on her couch. Her home physical therapist came at noon today and patient complained of being tired. Home physical therapist took her blood pressure and had her stand up and the patient felt very lightheaded and had tunnel vision. She had to sit back down on the couch. Her blood pressure was found to be low. She did not have full syncope. No chest pain or shortness of breath. She has had no worsening orthopnea, leg swelling. No recent fevers, cough. No headache or stroke symptoms. No abdominal pain, nausea, vomiting, or diarrhea. Patient admits to chronic low oral and fluid intake. She states that a lot of times in the morning she feels nauseous and does not eat a lot. Her blood sugar was not low and she states that this feels different than when she gets low blood sugar. The onset of this condition was acute. The course is constant. Aggravating factors: none. Alleviating factors: none.   Medications were adjusted at last visit: coreg decreased and ramipril was stopped. Pt states she is taking her medications appropriately.    Patient is a 69 y.o. female presenting with near-syncope. The history is provided by the patient and medical records.  Near Syncope Associated symptoms include fatigue. Pertinent negatives include no abdominal pain, chest pain, coughing, diaphoresis, fever, nausea, neck pain, rash, vomiting or weakness.    Past Medical History   Diagnosis Date  . Diabetes mellitus without complication   . Hypertension   . Coronary artery disease    Past Surgical History  Procedure Laterality Date  . Coronary artery bypass graft    . Bladder tack    . Cholecystectomy    . Abdominal hysterectomy    . Fracture surgery    . Cardiac surgery     Family History  Problem Relation Age of Onset  . CAD Other   . CVA Other    History  Substance Use Topics  . Smoking status: Never Smoker   . Smokeless tobacco: Not on file  . Alcohol Use: No   OB History    No data available     Review of Systems  Constitutional: Positive for fatigue. Negative for fever and diaphoresis.  Eyes: Negative for redness.  Respiratory: Negative for cough and shortness of breath.   Cardiovascular: Positive for near-syncope. Negative for chest pain, palpitations and leg swelling.  Gastrointestinal: Negative for nausea, vomiting and abdominal pain.  Genitourinary: Negative for dysuria.  Musculoskeletal: Negative for back pain and neck pain.  Skin: Negative for rash.  Neurological: Positive for syncope (near syncope) and light-headedness. Negative for weakness.  Psychiatric/Behavioral: The patient is not nervous/anxious.     Allergies  Phenergan  Home Medications   Prior to Admission medications   Medication Sig Start Date End Date Taking? Authorizing Provider  carvedilol (COREG) 25 MG tablet Take 0.5 tablets (12.5 mg total) by mouth 2 (two) times daily with a meal. 02/18/15  Yes Rhetta Mura, MD  gabapentin (NEURONTIN) 600 MG tablet Take 1 tablet (  600 mg total) by mouth 3 (three) times daily. 02/18/15  Yes Rhetta MuraJai-Gurmukh Samtani, MD  insulin lispro protamine-lispro (HUMALOG 75/25 MIX) (75-25) 100 UNIT/ML SUSP injection Inject 4-8 Units into the skin 3 (three) times daily. 150-300 = 4 units, over 300 = 8 units   Yes Historical Provider, MD  LEVEMIR FLEXTOUCH 100 UNIT/ML Pen Inject 8-10 Units into the skin 2 (two) times daily. 11/27/14  Yes  Historical Provider, MD  meclizine (ANTIVERT) 25 MG tablet Take 25 mg by mouth daily as needed for dizziness.  11/28/14  Yes Historical Provider, MD  omeprazole (PRILOSEC) 20 MG capsule Take 20 mg by mouth daily.   Yes Historical Provider, MD  simvastatin (ZOCOR) 20 MG tablet Take 20 mg by mouth daily.   Yes Historical Provider, MD   BP 140/62 mmHg  Pulse 68  Temp(Src) 98.2 F (36.8 C) (Oral)  Resp 21  Ht 5\' 2"  (1.575 m)  Wt 156 lb (70.761 kg)  BMI 28.53 kg/m2  SpO2 100%   Physical Exam  Constitutional: She appears well-developed and well-nourished.  HENT:  Head: Normocephalic and atraumatic.  Mouth/Throat: Mucous membranes are normal. Mucous membranes are not dry.  Mucous membranes are slighly dry.  Eyes: Conjunctivae are normal.  Neck: Trachea normal and normal range of motion. Neck supple. Normal carotid pulses and no JVD present. No muscular tenderness present. Carotid bruit is not present. No tracheal deviation present.  Cardiovascular: Normal rate, regular rhythm, S1 normal, S2 normal and intact distal pulses.  Exam reveals no decreased pulses.   Murmur (faint systolic murmur) heard. Pulmonary/Chest: Effort normal. No respiratory distress. She has no wheezes. She exhibits no tenderness.  Abdominal: Soft. Normal aorta and bowel sounds are normal. There is no tenderness. There is no rebound and no guarding.  Musculoskeletal: Normal range of motion.  Neurological: She is alert.  Skin: Skin is warm and dry. She is not diaphoretic. No cyanosis. No pallor.  Psychiatric: She has a normal mood and affect.  Nursing note and vitals reviewed.   ED Course  Procedures (including critical care time) Labs Review Labs Reviewed  CBC WITH DIFFERENTIAL/PLATELET - Abnormal; Notable for the following:    RBC 3.51 (*)    Hemoglobin 10.8 (*)    HCT 31.4 (*)    All other components within normal limits  BASIC METABOLIC PANEL - Abnormal; Notable for the following:    Potassium 3.4 (*)     Glucose, Bld 210 (*)    Creatinine, Ser 1.13 (*)    Calcium 8.7 (*)    GFR calc non Af Amer 50 (*)    GFR calc Af Amer 57 (*)    All other components within normal limits  I-STAT TROPOININ, ED    Imaging Review No results found.   EKG Interpretation None       2:43 PM Patient seen and examined. Work-up initiated. Medications ordered.   Vital signs reviewed and are as follows: BP 140/62 mmHg  Pulse 68  Temp(Src) 98.2 F (36.8 C) (Oral)  Resp 21  Ht 5\' 2"  (1.575 m)  Wt 156 lb (70.761 kg)  BMI 28.53 kg/m2  SpO2 100%  ED ECG REPORT   Date: 03/03/2015  Rate: 69  Rhythm: normal sinus rhythm  QRS Axis: normal  Intervals: normal  ST/T Wave abnormalities: normal  Conduction Disutrbances:none  Narrative Interpretation:   Old EKG Reviewed: unchanged  3:57 PM Patient doing well. She still c/o a little dizziness but she has not yet received fluids. Pt discussed with  and seen by Dr. Madilyn Hook.   Plan: fluids, have patient ambulate, if doing well d/c to home with PCP f/u.   Handoff to Lawyer PA-C at shift change.   MDM   Final diagnoses:  Near syncope   Patient with near syncope, h/o mild CHF not requiring diuretics -- similar sx today as she did previous to admission several weeks ago. Her meds were changed then and she had an ECHO. Symptoms today are mild and she is not significantly orthostatic here. No CP, SOB. No signs of decompensated HF. Pending completion of treatment.    Renne Crigler, PA-C 03/03/15 1603  Tilden Fossa, MD 03/04/15 [id number]

## 2015-03-03 NOTE — ED Provider Notes (Signed)
I discussed with the patient the option of being admitted for possible rehabilitation placement versus going home and the patient states she would like to go home and follow-up with her primary care doctor.  Patient is advised to return here for any worsening in her condition  Charlestine NightChristopher Veida Spira, PA-C 03/03/15 2005

## 2015-03-04 ENCOUNTER — Other Ambulatory Visit: Payer: Self-pay

## 2015-03-04 NOTE — Patient Outreach (Signed)
Additionally: Discussed with social worker about Primary MD.  I was informed that patient is no longer going to see Dr. Allyne GeeSanders  But has a new primary MD to see. Patient has an appointment upcoming to get established with new MD.  Rowe PavyAmanda Nainika Newlun, RN, BSN, CEN [facility] Care Coordinator [phone]

## 2015-03-04 NOTE — Patient Outreach (Signed)
Attempted to reach patient without success.  Reviewed EPIC and noted that patient was in the ED last night and declined admission. Case collaboration with [facility] social worker [employer]year TireCrystal Land who has been in contact with patient.  When Crystal completes home visit she is encourage patient to reach out to case manager.    No response to letter that was mailed in attempt to outreach patient.  Plan:  Will await call back from social worker and patient.  Rowe PavyAmanda Ramzi Brathwaite, RN, BSN, CEN [facility] Care Coordinator [phone]

## 2015-03-05 ENCOUNTER — Other Ambulatory Visit: Payer: Self-pay | Admitting: Pharmacist

## 2015-03-05 ENCOUNTER — Other Ambulatory Visit: Payer: Self-pay | Admitting: *Deleted

## 2015-03-05 NOTE — Patient Outreach (Signed)
I called the patient to discuss medication management and assistance and I had to leave a HIPAA compliant message for patient to return my phone call.  If I do not hear from patient, I will await updates from [facility] social worker, who has been able to reach the patient, as this is my third unsuccessful attempt to contact the patient.   Will continue to coordinate care with [facility] social worker and RNCM.  Juanita Craver, PharmD, BCPS [facility] Resident [phone]

## 2015-03-05 NOTE — Patient Outreach (Signed)
[facility]) Care Management  03/05/2015  [name] July 24, 1946 [id number]   Phone call to patient to re-schedule home visit to 03/06/15 at 2:00pm.  HIPPA compliant voicemail message left requesting a return call.   [name] [facility] Care Management [phone]

## 2015-03-11 ENCOUNTER — Other Ambulatory Visit: Payer: Self-pay | Admitting: *Deleted

## 2015-03-11 NOTE — Patient Outreach (Addendum)
[facility]) Care Management  03/11/2015  [name] 22-Jun-1946 [id number]  Phone call to patient to confirm appointment for 03/12/15 at 10:30am.  HIPPA compliant voicemail message left requesting a return call.  [name] [facility] Care Management [phone]

## 2015-03-12 ENCOUNTER — Ambulatory Visit: Payer: Self-pay | Admitting: *Deleted

## 2015-03-12 NOTE — Patient Outreach (Signed)
Unable to reach patient x 3 attempts and after letter ( with a 10 day waiting time). No response to any attempts. [facility] social worker has been able to outreach patient one time but has been unsuccessful after initial attempt. Will close to nursing at this time.  [facility] social worker remains active at this time.    Rowe Pavy, RN, BSN, CEN [facility] [employer] [facility] [phone]

## 2015-03-16 ENCOUNTER — Ambulatory Visit: Payer: Self-pay | Admitting: *Deleted

## 2015-04-02 ENCOUNTER — Other Ambulatory Visit: Payer: Self-pay | Admitting: *Deleted

## 2015-04-02 NOTE — Patient Outreach (Addendum)
[facility]) Care Management  04/02/2015  Salley ScarletLinda K Wagster May 05, 1946 [id number]  Phone call to patient to follow up on missed home visit and to assess for continued social work involvement.  HIPPA compliant voicemail left for a return call.   [name], LCSW [facility] Care [facility] [phone]

## 2015-04-10 ENCOUNTER — Other Ambulatory Visit: Payer: Self-pay | Admitting: *Deleted

## 2015-04-10 NOTE — Patient Outreach (Signed)
[facility]) Care Management  04/10/2015  Salley ScarletLinda K Menor 08-Apr-1946 [id number]   Phone call to patient to assess for social work needs. HIPPA  Compliant voicemail left for a return call.   Adriana ReamsChrystal Oswald Pott, LCSW [facility] Care [facility] [phone]

## 2015-04-15 ENCOUNTER — Encounter: Payer: Self-pay | Admitting: *Deleted

## 2015-04-15 NOTE — Patient Outreach (Signed)
[facility]) Care Management  04/15/2015  Katie ScarletLinda K Martin 06-20-1946 [id number]  Third unsuccessful phone call to reach patient.  Letter sent to patient requesting a return phone call.  Patient's case to be closed in 10 days if no response.    Katie ReamsChrystal Land, LCSW [facility] Care [facility] [phone]

## 2015-05-01 ENCOUNTER — Encounter: Payer: Self-pay | Admitting: *Deleted

## 2015-05-01 NOTE — Patient Outreach (Signed)
[facility]) Care Management  05/01/2015  [name] 12-20-1945 [id number]   Case closure letter sent to patient due to inability to maintain contact.   [name] [facility] Care Management [phone]

## 2015-05-07 ENCOUNTER — Other Ambulatory Visit: Payer: Self-pay | Admitting: Pharmacist

## 2015-05-07 NOTE — Patient Outreach (Signed)
Unable to reach patient x 3 attempts and after letter. No response to any attempts. RNCM and social worker have been unable to reach the patient as well. Will close pharmacy program.   Juanita Craver, PharmD, [facility] Clinical Pharmacist [facility] [phone]

## 2015-05-08 NOTE — Patient Outreach (Signed)
[facility]) Care Management  05/08/2015  [name] 1946-04-18 [id number]   Notification from disciplines involved to close case due to unable to maintain contact with patient for [facility] Care Management services.  Thanks, Corrie Mckusick. Sharlee Blew [facility] Care Management [facility] CM Assistant Phone: [phone] Fax: [phone]

## 2015-06-15 ENCOUNTER — Emergency Department: Payer: Medicare Other

## 2015-06-15 ENCOUNTER — Encounter: Payer: Self-pay | Admitting: Emergency Medicine

## 2015-06-15 DIAGNOSIS — N183 Chronic kidney disease, stage 3 (moderate): Secondary | ICD-10-CM | POA: Insufficient documentation

## 2015-06-15 DIAGNOSIS — I129 Hypertensive chronic kidney disease with stage 1 through stage 4 chronic kidney disease, or unspecified chronic kidney disease: Secondary | ICD-10-CM | POA: Diagnosis not present

## 2015-06-15 DIAGNOSIS — M79661 Pain in right lower leg: Secondary | ICD-10-CM | POA: Insufficient documentation

## 2015-06-15 DIAGNOSIS — Z79899 Other long term (current) drug therapy: Secondary | ICD-10-CM | POA: Insufficient documentation

## 2015-06-15 DIAGNOSIS — E119 Type 2 diabetes mellitus without complications: Secondary | ICD-10-CM | POA: Insufficient documentation

## 2015-06-15 DIAGNOSIS — Z794 Long term (current) use of insulin: Secondary | ICD-10-CM | POA: Diagnosis not present

## 2015-06-15 NOTE — ED Notes (Signed)
Pt reports 3 days of right calf pain, denies hx of blood clots, doesn't take blood thinners. Reports does a lot of sitting at the house.

## 2015-06-16 ENCOUNTER — Emergency Department
Admission: EM | Admit: 2015-06-16 | Discharge: 2015-06-16 | Disposition: A | Discharge status: Home / Self Care | Payer: Medicare Other | Attending: Emergency Medicine | Admitting: Emergency Medicine

## 2015-06-16 DIAGNOSIS — M79661 Pain in right lower leg: Secondary | ICD-10-CM | POA: Diagnosis not present

## 2015-06-16 DIAGNOSIS — M791 Myalgia, unspecified site: Secondary | ICD-10-CM

## 2015-06-16 LAB — COMPREHENSIVE METABOLIC PANEL
ALBUMIN: 3.8 g/dL (ref 3.5–5.0)
ALK PHOS: 53 U/L (ref 38–126)
ALT: 14 U/L (ref 14–54)
ANION GAP: 6 (ref 5–15)
AST: 18 U/L (ref 15–41)
BILIRUBIN TOTAL: 0.6 mg/dL (ref 0.3–1.2)
BUN: 16 mg/dL (ref 6–20)
CALCIUM: 9.3 mg/dL (ref 8.9–10.3)
CO2: 29 mmol/L (ref 22–32)
CREATININE: 1.14 mg/dL — AB (ref 0.44–1.00)
Chloride: 104 mmol/L (ref 101–111)
GFR calc Af Amer: 56 mL/min — ABNORMAL LOW (ref >60)
GFR calc non Af Amer: 49 mL/min — ABNORMAL LOW (ref >60)
GLUCOSE: 203 mg/dL — AB (ref 65–99)
Potassium: 4.4 mmol/L (ref 3.5–5.1)
SODIUM: 139 mmol/L (ref 135–145)
TOTAL PROTEIN: 7.1 g/dL (ref 6.5–8.1)

## 2015-06-16 LAB — CK: Total CK: 37 U/L — ABNORMAL LOW (ref 38–234)

## 2015-06-16 MED ORDER — CYCLOBENZAPRINE HCL 10 MG PO TABS: 10.0000 mg | ORAL_TABLET | Freq: Three times a day (TID) | ORAL | Status: AC | PRN

## 2015-06-16 MED ORDER — OXYCODONE-ACETAMINOPHEN 5-325 MG PO TABS
1.0000 | ORAL_TABLET | Freq: Once | ORAL | Status: AC
  Administered 2015-06-16: 1 via ORAL
  Filled 2015-06-16: qty 1

## 2015-06-16 NOTE — ED Provider Notes (Signed)
[facility] Emergency Department Provider Note  ____________________________________________  Time seen: 1:30 AM  I have reviewed the triage vital signs and the nursing notes.   HISTORY  Chief Complaint Leg Pain      HPI [name] is a 69 y.o. female presents with right calf pain times a few months with acute worsening this week. Patient states current pain score 7 out of 10. Patient denies any trauma. She states she informed her doctor of the pain in the past and was informed that it is either arthritis or muscular pain.    Past Medical History  Diagnosis Date  . Diabetes mellitus without complication   . Hypertension   . Coronary artery disease     Patient Active Problem List   Diagnosis Date Noted  . Rectal bleeding 02/16/2015  . Diarrhea 02/16/2015  . Syncope 02/16/2015  . Diabetes mellitus type 2, uncontrolled 02/16/2015  . CKD (chronic kidney disease) stage 3, GFR 30-59 ml/min 02/16/2015  . Hypertension 02/16/2015  . Faintness   . Abdominal pain   . Heme positive stool   . Hyperglycemia 02/15/2015    Past Surgical History  Procedure Laterality Date  . Coronary artery bypass graft    . Bladder tack    . Cholecystectomy    . Abdominal hysterectomy    . Fracture surgery    . Cardiac surgery      Current Outpatient Rx  Name  Route  Sig  Dispense  Refill  . carvedilol (COREG) 25 MG tablet   Oral   Take 0.5 tablets (12.5 mg total) by mouth 2 (two) times daily with a meal.         . gabapentin (NEURONTIN) 600 MG tablet   Oral   Take 1 tablet (600 mg total) by mouth 3 (three) times daily.   60 tablet   0   . insulin lispro protamine-lispro (HUMALOG 75/25 MIX) (75-25) 100 UNIT/ML SUSP injection   Subcutaneous   Inject 4-8 Units into the skin 3 (three) times daily. 150-300 = 4 units, over 300 = 8 units         . LEVEMIR FLEXTOUCH 100 UNIT/ML Pen   Subcutaneous   Inject 8-10 Units into the skin 2 (two) times daily.      1     Dispense as written.   . meclizine (ANTIVERT) 25 MG tablet   Oral   Take 25 mg by mouth daily as needed for dizziness.       1   . omeprazole (PRILOSEC) 20 MG capsule   Oral   Take 20 mg by mouth daily.         . simvastatin (ZOCOR) 20 MG tablet   Oral   Take 20 mg by mouth daily.           Allergies Phenergan  Family History  Problem Relation Age of Onset  . CAD Other   . CVA Other     Social History Social History  Substance Use Topics  . Smoking status: Never Smoker   . Smokeless tobacco: None  . Alcohol Use: No    Review of Systems  Constitutional: Negative for fever. Eyes: Negative for visual changes. ENT: Negative for sore throat. Cardiovascular: Negative for chest pain. Respiratory: Negative for shortness of breath. Gastrointestinal: Negative for abdominal pain, vomiting and diarrhea. Genitourinary: Negative for dysuria. Musculoskeletal:Positive for right calf pain Skin: Negative for rash. Neurological: Negative for headaches, focal weakness or numbness.  10-point ROS otherwise negative.  ____________________________________________   PHYSICAL EXAM:  VITAL SIGNS: ED Triage Vitals  Enc Vitals Group     BP 06/15/15 2101 133/72 mmHg     Pulse Rate 06/15/15 2101 74     Resp 06/15/15 2101 16     Temp 06/15/15 2101 98.1 F (36.7 C)     Temp Source 06/15/15 2101 Oral     SpO2 06/15/15 2101 100 %     Weight 06/15/15 2101 165 lb (74.844 kg)     Height 06/15/15 2101  (1.575 m)     Head Cir --      Peak Flow --      Pain Score 06/15/15 2101 10     Pain Loc --      Pain Edu? --      Excl. in GC? --      Constitutional: Alert and oriented. Well appearing and in no distress. Eyes: Conjunctivae are normal. PERRL. Normal extraocular movements. ENT   Head: Normocephalic and atraumatic.   Nose: No congestion/rhinnorhea.   Mouth/Throat: Mucous membranes are moist.   Neck: No stridor. Cardiovascular: Normal rate,  regular rhythm. Normal and symmetric distal pulses are present in all extremities. No murmurs, rubs, or gallops. Respiratory: Normal respiratory effort without tachypnea nor retractions. Breath sounds are clear and equal bilaterally. No wheezes/rales/rhonchi. Gastrointestinal: Soft and nontender. No distention. There is no CVA tenderness. Genitourinary: deferred Musculoskeletal: Pain to palpation of the patient's calf  Neurologic:  Normal speech and language. No gross focal neurologic deficits are appreciated. Speech is normal.  Skin:  Skin is warm, dry and intact. No rash noted.   ____________________________________________    LABS (pertinent positives/negatives)    RADIOLOGY   US Venous Img Lower Unilateral Right (Final result) Result time: 06/15/15 23:55:20   Final result by Rad Results In Interface (06/15/15 23:55:20)   Narrative:   CLINICAL DATA: Painful calf for 3 days, diabetes mellitus  EXAM: RIGHT LOWER EXTREMITY VENOUS DOPPLER ULTRASOUND  TECHNIQUE: Gray-scale sonography with graded compression, as well as color Doppler and duplex ultrasound were performed to evaluate the lower extremity deep venous systems from the level of the common femoral vein and including the common femoral, femoral, profunda femoral, popliteal and calf veins including the posterior tibial, peroneal and gastrocnemius veins when visible. The superficial great saphenous vein was also interrogated. Spectral Doppler was utilized to evaluate flow at rest and with distal augmentation maneuvers in the common femoral, femoral and popliteal veins.  COMPARISON: None  FINDINGS: Contralateral Common Femoral Vein: Respiratory phasicity is normal and symmetric with the symptomatic side. No evidence of thrombus. Normal compressibility.  Common Femoral Vein: No evidence of thrombus. Normal compressibility, respiratory phasicity and response to augmentation.  Saphenofemoral Junction: No evidence  of thrombus. Normal compressibility and flow on color Doppler imaging.  Profunda Femoral Vein: No evidence of thrombus. Normal compressibility and flow on color Doppler imaging.  Femoral Vein: No evidence of thrombus. Normal compressibility, respiratory phasicity and response to augmentation.  Popliteal Vein: No evidence of thrombus. Normal compressibility, respiratory phasicity and response to augmentation.  Calf Veins: No evidence of thrombus. Normal compressibility and flow on color Doppler imaging.  Superficial Great Saphenous Vein: No evidence of thrombus. Normal compressibility and flow on color Doppler imaging.  Venous Reflux: None.  Other Findings: None.  IMPRESSION: No evidence of deep venous thrombosis in the RIGHT lower extremity.   Electronically Signed By: Ulyses Southward M.D. On: 06/15/2015 23:55          INITIAL IMPRESSION /  ASSESSMENT AND PLAN / ED COURSE  Pertinent labs & imaging results that were available during my care of the patient were reviewed by me and considered in my medical decision making (see chart for details).  Patient's ultrasound of the lower extremity revealed no DVT. Given chronicity of pain and muscular nature considered possible etiology to be simvastatin. I advised patient to follow up with primary care provider   ____________________________________________   FINAL CLINICAL IMPRESSION(S) / ED DIAGNOSES  Final diagnoses:  Muscle pain      Darci Current, MD 06/16/15 [phone]

## 2015-06-16 NOTE — Discharge Instructions (Signed)

## 2015-09-26 ENCOUNTER — Inpatient Hospital Stay (HOSPITAL_COMMUNITY): Payer: Medicare Other

## 2015-09-26 ENCOUNTER — Encounter (HOSPITAL_COMMUNITY): Payer: Self-pay

## 2015-09-26 ENCOUNTER — Inpatient Hospital Stay (HOSPITAL_COMMUNITY)
Admission: EM | Admit: 2015-09-26 | Discharge: 2015-10-28 | DRG: 296 | Disposition: E | Payer: Medicare Other | Attending: Pulmonary Disease | Admitting: Pulmonary Disease

## 2015-09-26 ENCOUNTER — Emergency Department (HOSPITAL_COMMUNITY): Payer: Medicare Other

## 2015-09-26 DIAGNOSIS — J9601 Acute respiratory failure with hypoxia: Secondary | ICD-10-CM | POA: Diagnosis present

## 2015-09-26 DIAGNOSIS — N179 Acute kidney failure, unspecified: Secondary | ICD-10-CM | POA: Diagnosis present

## 2015-09-26 DIAGNOSIS — E119 Type 2 diabetes mellitus without complications: Secondary | ICD-10-CM | POA: Diagnosis present

## 2015-09-26 DIAGNOSIS — I469 Cardiac arrest, cause unspecified: Secondary | ICD-10-CM | POA: Diagnosis present

## 2015-09-26 DIAGNOSIS — Z7951 Long term (current) use of inhaled steroids: Secondary | ICD-10-CM

## 2015-09-26 DIAGNOSIS — Z794 Long term (current) use of insulin: Secondary | ICD-10-CM

## 2015-09-26 DIAGNOSIS — G935 Compression of brain: Secondary | ICD-10-CM | POA: Diagnosis present

## 2015-09-26 DIAGNOSIS — I5022 Chronic systolic (congestive) heart failure: Secondary | ICD-10-CM | POA: Diagnosis present

## 2015-09-26 DIAGNOSIS — D689 Coagulation defect, unspecified: Secondary | ICD-10-CM | POA: Diagnosis present

## 2015-09-26 DIAGNOSIS — G931 Anoxic brain damage, not elsewhere classified: Secondary | ICD-10-CM | POA: Diagnosis present

## 2015-09-26 DIAGNOSIS — Z79899 Other long term (current) drug therapy: Secondary | ICD-10-CM

## 2015-09-26 DIAGNOSIS — I2510 Atherosclerotic heart disease of native coronary artery without angina pectoris: Secondary | ICD-10-CM | POA: Diagnosis present

## 2015-09-26 DIAGNOSIS — D6489 Other specified anemias: Secondary | ICD-10-CM | POA: Diagnosis present

## 2015-09-26 DIAGNOSIS — R570 Cardiogenic shock: Secondary | ICD-10-CM | POA: Diagnosis present

## 2015-09-26 DIAGNOSIS — E874 Mixed disorder of acid-base balance: Secondary | ICD-10-CM | POA: Diagnosis present

## 2015-09-26 DIAGNOSIS — E785 Hyperlipidemia, unspecified: Secondary | ICD-10-CM | POA: Diagnosis present

## 2015-09-26 DIAGNOSIS — R296 Repeated falls: Secondary | ICD-10-CM | POA: Diagnosis present

## 2015-09-26 DIAGNOSIS — E875 Hyperkalemia: Secondary | ICD-10-CM | POA: Diagnosis present

## 2015-09-26 DIAGNOSIS — Z515 Encounter for palliative care: Secondary | ICD-10-CM | POA: Diagnosis present

## 2015-09-26 DIAGNOSIS — Z66 Do not resuscitate: Secondary | ICD-10-CM | POA: Diagnosis present

## 2015-09-26 DIAGNOSIS — Z951 Presence of aortocoronary bypass graft: Secondary | ICD-10-CM | POA: Diagnosis not present

## 2015-09-26 DIAGNOSIS — I10 Essential (primary) hypertension: Secondary | ICD-10-CM | POA: Diagnosis present

## 2015-09-26 HISTORY — DX: Pure hypercholesterolemia, unspecified: E78.00

## 2015-09-26 LAB — CBC WITH DIFFERENTIAL/PLATELET
Basophils Absolute: 0.2 10*3/uL — ABNORMAL HIGH (ref 0.0–0.1)
Basophils Relative: 1 %
EOS ABS: 0 10*3/uL (ref 0.0–0.7)
EOS PCT: 0 %
HEMATOCRIT: 34.7 % — AB (ref 36.0–46.0)
Hemoglobin: 11.5 g/dL — ABNORMAL LOW (ref 12.0–15.0)
Lymphocytes Relative: 53 %
Lymphs Abs: 10.1 10*3/uL — ABNORMAL HIGH (ref 0.7–4.0)
MCH: 31.2 pg (ref 26.0–34.0)
MCHC: 33.1 g/dL (ref 30.0–36.0)
MCV: 94 fL (ref 78.0–100.0)
MONO ABS: 0.4 10*3/uL (ref 0.1–1.0)
Monocytes Relative: 2 %
NEUTROS PCT: 44 %
Neutro Abs: 8.4 10*3/uL — ABNORMAL HIGH (ref 1.7–7.7)
PLATELETS: 137 10*3/uL — AB (ref 150–400)
RBC: 3.69 MIL/uL — AB (ref 3.87–5.11)
RDW: 13.1 % (ref 11.5–15.5)
WBC: 19.1 10*3/uL — AB (ref 4.0–10.5)

## 2015-09-26 LAB — POCT I-STAT 3, ART BLOOD GAS (G3+)
ACID-BASE DEFICIT: 11 mmol/L — AB (ref 0.0–2.0)
BICARBONATE: 18 meq/L — AB (ref 20.0–24.0)
O2 SAT: 96 %
PCO2 ART: 46.1 mmHg — AB (ref 35.0–45.0)
PO2 ART: 83 mmHg (ref 80.0–100.0)
Patient temperature: 33.2
TCO2: 20 mmol/L (ref 0–100)
pH, Arterial: 7.176 — CL (ref 7.350–7.450)

## 2015-09-26 LAB — URINALYSIS, ROUTINE W REFLEX MICROSCOPIC
GLUCOSE, UA: 250 mg/dL — AB
Ketones, ur: 15 mg/dL — AB
Nitrite: NEGATIVE
PH: 5 (ref 5.0–8.0)
Protein, ur: 100 mg/dL — AB
SPECIFIC GRAVITY, URINE: 1.016 (ref 1.005–1.030)

## 2015-09-26 LAB — URINE MICROSCOPIC-ADD ON

## 2015-09-26 LAB — PROTIME-INR
INR: 1.54 — AB (ref 0.00–1.49)
Prothrombin Time: 18.5 seconds — ABNORMAL HIGH (ref 11.6–15.2)

## 2015-09-26 LAB — COMPREHENSIVE METABOLIC PANEL
ALBUMIN: 2.4 g/dL — AB (ref 3.5–5.0)
ALT: 36 U/L (ref 14–54)
AST: 91 U/L — AB (ref 15–41)
Alkaline Phosphatase: 58 U/L (ref 38–126)
Anion gap: 16 — ABNORMAL HIGH (ref 5–15)
BUN: 24 mg/dL — AB (ref 6–20)
CHLORIDE: 104 mmol/L (ref 101–111)
CO2: 16 mmol/L — AB (ref 22–32)
Calcium: 7.8 mg/dL — ABNORMAL LOW (ref 8.9–10.3)
Creatinine, Ser: 3.05 mg/dL — ABNORMAL HIGH (ref 0.44–1.00)
GFR calc Af Amer: 17 mL/min — ABNORMAL LOW (ref >60)
GFR calc non Af Amer: 15 mL/min — ABNORMAL LOW (ref >60)
GLUCOSE: 393 mg/dL — AB (ref 65–99)
POTASSIUM: 4.9 mmol/L (ref 3.5–5.1)
SODIUM: 136 mmol/L (ref 135–145)
Total Bilirubin: 1.1 mg/dL (ref 0.3–1.2)
Total Protein: 4.9 g/dL — ABNORMAL LOW (ref 6.5–8.1)

## 2015-09-26 LAB — I-STAT CHEM 8, ED
BUN: 27 mg/dL — AB (ref 6–20)
CALCIUM ION: 1.06 mmol/L — AB (ref 1.13–1.30)
CHLORIDE: 105 mmol/L (ref 101–111)
CREATININE: 2.4 mg/dL — AB (ref 0.44–1.00)
GLUCOSE: 363 mg/dL — AB (ref 65–99)
HCT: 35 % — ABNORMAL LOW (ref 36.0–46.0)
Hemoglobin: 11.9 g/dL — ABNORMAL LOW (ref 12.0–15.0)
POTASSIUM: 4.7 mmol/L (ref 3.5–5.1)
SODIUM: 138 mmol/L (ref 135–145)
TCO2: 17 mmol/L (ref 0–100)

## 2015-09-26 LAB — I-STAT CG4 LACTIC ACID, ED: LACTIC ACID, VENOUS: 9.34 mmol/L — AB (ref 0.5–2.0)

## 2015-09-26 LAB — I-STAT TROPONIN, ED: TROPONIN I, POC: 0.41 ng/mL — AB (ref 0.00–0.08)

## 2015-09-26 MED ORDER — SODIUM CHLORIDE 0.9 % IV SOLN: INTRAVENOUS | Status: DC | PRN

## 2015-09-26 MED ORDER — HEPARIN SODIUM (PORCINE) 5000 UNIT/ML IJ SOLN: 5000.0000 [IU] | Freq: Three times a day (TID) | INTRAMUSCULAR | Status: DC

## 2015-09-26 MED ORDER — FAMOTIDINE IN NACL 20-0.9 MG/50ML-% IV SOLN: 20.0000 mg | Freq: Two times a day (BID) | INTRAVENOUS | Status: DC

## 2015-09-26 MED ORDER — SODIUM CHLORIDE 0.9 % IV SOLN
1.0000 mg/h | INTRAVENOUS | Status: DC
  Filled 2015-09-26: qty 10

## 2015-09-26 MED ORDER — PIPERACILLIN-TAZOBACTAM 3.375 G IVPB 30 MIN: 3.3750 g | Freq: Once | INTRAVENOUS | Status: DC

## 2015-09-26 MED ORDER — NOREPINEPHRINE BITARTRATE 1 MG/ML IV SOLN
0.0000 ug/min | INTRAVENOUS | Status: DC
  Administered 2015-09-26: 40 ug/min via INTRAVENOUS
  Filled 2015-09-26 (×2): qty 4

## 2015-09-26 MED ORDER — PIPERACILLIN-TAZOBACTAM 3.375 G IVPB
3.3750 g | Freq: Three times a day (TID) | INTRAVENOUS | Status: DC
  Filled 2015-09-26 (×2): qty 50

## 2015-09-26 MED ORDER — NOREPINEPHRINE BITARTRATE 1 MG/ML IV SOLN
0.0000 ug/min | INTRAVENOUS | Status: DC
  Filled 2015-09-26: qty 16

## 2015-09-26 MED ORDER — SODIUM CHLORIDE 0.9 % IV SOLN
INTRAVENOUS | Status: AC | PRN
  Administered 2015-09-26: 1000 mL via INTRAVENOUS
  Administered 2015-09-26: 150 mL/h via INTRAVENOUS

## 2015-09-26 MED ORDER — SODIUM CHLORIDE 0.9 % IV SOLN
2000.0000 mL | Freq: Once | INTRAVENOUS | Status: AC
  Administered 2015-09-26: 2000 mL via INTRAVENOUS

## 2015-09-26 MED ORDER — SODIUM BICARBONATE 8.4 % IV SOLN
100.0000 meq | Freq: Once | INTRAVENOUS | Status: AC
  Administered 2015-09-26: 100 meq via INTRAVENOUS

## 2015-09-26 MED ORDER — EPINEPHRINE HCL 1 MG/ML IJ SOLN
0.5000 ug/min | INTRAMUSCULAR | Status: DC
  Administered 2015-09-26: 15 ug/min via INTRAVENOUS
  Filled 2015-09-26 (×2): qty 4

## 2015-09-26 MED ORDER — VANCOMYCIN HCL 10 G IV SOLR
1500.0000 mg | Freq: Once | INTRAVENOUS | Status: DC
  Filled 2015-09-26: qty 1500

## 2015-09-26 MED ORDER — SODIUM CHLORIDE 0.9 % IV SOLN
1.0000 ug/kg/min | INTRAVENOUS | Status: DC
  Filled 2015-09-26: qty 20

## 2015-09-26 MED ORDER — VASOPRESSIN 20 UNIT/ML IV SOLN
0.0300 [IU]/min | INTRAVENOUS | Status: DC
  Administered 2015-09-26: 0.03 [IU]/min via INTRAVENOUS
  Filled 2015-09-26: qty 2

## 2015-09-26 MED ORDER — VANCOMYCIN HCL IN DEXTROSE 750-5 MG/150ML-% IV SOLN: 750.0000 mg | INTRAVENOUS | Status: DC

## 2015-09-26 MED ORDER — SODIUM BICARBONATE 8.4 % IV SOLN
INTRAVENOUS | Status: AC
  Administered 2015-09-26: 50 meq
  Filled 2015-09-26: qty 50

## 2015-09-26 MED ORDER — EPINEPHRINE HCL 0.1 MG/ML IJ SOSY
PREFILLED_SYRINGE | INTRAMUSCULAR | Status: AC | PRN
  Administered 2015-09-26: 1 mg via INTRAVENOUS

## 2015-09-26 MED ORDER — EPINEPHRINE HCL 0.1 MG/ML IJ SOSY
PREFILLED_SYRINGE | INTRAMUSCULAR | Status: AC | PRN
  Administered 2015-09-26 (×4): 1 mg via INTRAVENOUS

## 2015-09-26 MED ORDER — SODIUM CHLORIDE 0.9 % IV SOLN: 2000.0000 mL | Freq: Once | INTRAVENOUS | Status: DC

## 2015-09-26 MED ORDER — ASPIRIN 300 MG RE SUPP: 300.0000 mg | RECTAL | Status: DC

## 2015-09-26 MED ORDER — SODIUM CHLORIDE 0.9 % IV SOLN
25.0000 ug/h | INTRAVENOUS | Status: DC
  Administered 2015-09-26: 75 ug/h via INTRAVENOUS
  Filled 2015-09-26: qty 50

## 2015-09-26 NOTE — Code Documentation (Signed)
CO2 monitoring applied 

## 2015-09-26 NOTE — Code Documentation (Signed)
Dr. Ranae Palmsyelverton and Dr. Lonia MadSeymore back at the bedside.

## 2015-09-26 NOTE — Procedures (Signed)
Arterial Catheter Insertion Procedure Note Haskell FlirtLinda Crite [id number] Mar 25, 1946  Procedure: Insertion of Arterial Catheter  Indications: Blood pressure monitoring  Procedure Details Consent: Unable to obtain consent because of emergent medical necessity. Time Out: Verified patient identification, verified procedure, site/side was marked, verified correct patient position, special equipment/implants available, medications/allergies/relevent history reviewed, required imaging and test results available.  Performed  Maximum sterile technique was used including antiseptics, gloves, gown, hand hygiene and mask. Skin prep: Chlorhexidine; local anesthetic administered 20 gauge catheter was inserted into left radial artery using the Seldinger technique.  Evaluation Blood flow good; BP tracing good. Complications: No apparent complications.   Augustine RadarCarter, Arlis Everly Marie 05/24/15

## 2015-09-26 NOTE — ED Notes (Addendum)
Per [facility], pt from home for syncopal episode in the bathroom. Husband heard her fall. EMS arrived and pt was in PEA at rate of 40. CPR started at 1901. Given total of 6 epi and 2 of narcan. IO to right tibia and 18g to LAC and 2 liters of cold saline started. CBG of 354, pulses back at 1933. Started on an EPI drip.

## 2015-09-26 NOTE — Code Documentation (Signed)
CODE BLUE NOTE  Patient Name: [name]   MRN: [id number]   Date of Birth/ Sex: 09/02/1946 , female      Admission Date: 08/29/2015  Attending Provider: Coralyn HellingVineet Sood, MD  Primary Diagnosis: <principal problem not specified> Cardiac arrest Shock    Indication: Pt was in her usual state of health until this PM, when she was noted to be bradycardic and blood pressure was unable to be obtained. Code blue was subsequently called. At the time of arrival on scene, ACLS protocol was underway. Earlier in the evening she had had a syncopal episode at home and was found to be in PEA by EMS. She received 6 of epi, narcan and fluids at that time and was subsequently started on an EPI drip. She was admitted for cardiac arrest.     Technical Description:  - CPR performance duration:  6  minutes  - Was defibrillation or cardioversion used? No   - Was external pacer placed? No  - Was patient intubated pre/post CPR? Yes    Medications Administered: Y = Yes; Blank = No Amiodarone  N  Atropine  N  Calcium  N  Epinephrine  Y (2)  Lidocaine  N  Magnesium  N  Norepinephrine  N  Phenylephrine  N  Sodium bicarbonate  Y (2)  Vasopressin  Y    Post CPR evaluation:  - Final Status - Was patient successfully resuscitated ? Yes - What is current rhythm? afib - What is current hemodynamic status? On epi and vasopressin drip; norepi drip ordered   Miscellaneous Information:  - Labs sent, including: ABG, i-stat troponin  - Primary team notified?  Yes  - Family Notified? Yes  - Additional notes/ transfer status:  To remain in 2H.        Katie BurkittHillary Moen Zahid Carneiro, MD  09/13/2015, 11:43 PM

## 2015-09-26 NOTE — Progress Notes (Signed)
eLink Physician-Brief Progress Note Patient Name: Katie FlirtLinda Martin DOB: 03/23/1946 MRN: [id number]   Date of Service  Dec 29, 2014  HPI/Events of Note  Hypotension/Refractory Shock - Currently on both Norepinephrine and Epinephrine IV infusions with BP = 57/44. A-line being placed at this time.   eICU Interventions  Will order:  1. Sodium bicarbonate 100 meq IV now.  2. Vasopressin IV infusion.  3. Please obtain ABG as soon as A-line placed.      Intervention Category Major Interventions: Acid-Base disturbance - evaluation and management;Hypotension - evaluation and management;Shock - evaluation and management  [name] [name] Dec 29, 2014, 11:24 PM

## 2015-09-26 NOTE — ED Provider Notes (Signed)
CSN: [id number]     Arrival date & time October 16, 2015  1953 History   First MD Initiated Contact with Patient 10-16-15 2002     Chief Complaint  Patient presents with  . Cardiac Arrest     (Consider location/radiation/quality/duration/timing/severity/associated sxs/prior Treatment) HPI Comments: Level 5 exception of care due to acuity of condition.  Patient is a 69 y.o. female presenting with syncope. The history is provided by the EMS personnel and a relative.  Loss of Consciousness Episode history:  Multiple Most recent episode:  Today Timing:  Constant Progression:  Worsening Chronicity:  New Context comment:  Unknown Witnessed: yes   Relieved by:  Nothing Worsened by:  Nothing tried Ineffective treatments:  Sitting up Associated symptoms: no fever     Past Medical History  Diagnosis Date  . Diabetes mellitus without complication (HCC)   . High cholesterol    History reviewed. No pertinent past surgical history. No family history on file. Social History  Substance Use Topics  . Smoking status: Unknown If Ever Smoked  . Smokeless tobacco: None  . Alcohol Use: None   OB History    No data available     Review of Systems  Unable to perform ROS: Intubated  Constitutional: Negative for fever.  Cardiovascular: Positive for syncope.      Allergies  Review of patient's allergies indicates no known allergies.  Home Medications   Prior to Admission medications   Medication Sig Start Date End Date Taking? Authorizing Provider  aspirin 81 MG chewable tablet Chew 81 mg by mouth daily.   Yes Historical Provider, MD  carvedilol (COREG) 12.5 MG tablet Take 12.5 mg by mouth 2 (two) times daily with a meal.   Yes Historical Provider, MD  gabapentin (NEURONTIN) 600 MG tablet Take 600 mg by mouth 3 (three) times daily.   Yes Historical Provider, MD  insulin detemir (LEVEMIR) 100 UNIT/ML injection Inject into the skin at bedtime.   Yes Historical Provider, MD  insulin lispro  (HUMALOG) 100 UNIT/ML injection Inject into the skin 3 (three) times daily before meals.   Yes Historical Provider, MD  ramipril (ALTACE) 5 MG capsule Take 5 mg by mouth daily.   Yes Historical Provider, MD  simvastatin (ZOCOR) 40 MG tablet Take 40 mg by mouth every evening.   Yes Historical Provider, MD  sitaGLIPtin (JANUVIA) 100 MG tablet Take 100 mg by mouth daily.   Yes Historical Provider, MD   BP 91/53 mmHg  Pulse 64  Temp(Src) 93 F (33.9 C)  Resp 10  Ht  (1.676 m)  Wt 80 kg  BMI 28.48 kg/m2  SpO2 100%  LMP  (LMP Unknown) Physical Exam  Constitutional: She appears well-developed and well-nourished. She has a sickly appearance. She appears ill. She is intubated. Backboard in place.  HENT:  Head: Head is without abrasion, without contusion and without laceration.  Right Ear: No hemotympanum.  Left Ear: No hemotympanum.  Eyes: Conjunctivae are normal. Right pupil is not reactive. Left pupil is not reactive.  Neck: Neck supple.  Cardiovascular: Normal rate and regular rhythm.  Exam reveals decreased pulses.   No murmur heard. Pulmonary/Chest: She is intubated.  Abdominal: Normal appearance. She exhibits no distension. There is no rigidity.  Neurological: She is unresponsive. GCS eye subscore is 1. GCS verbal subscore is 1. GCS motor subscore is 1.  Skin: No rash noted.    ED Course  .Intubation Date/Time: 16-Oct-2015 9:37 PM Performed by: Stacy Gardner Authorized by: Stacy Gardner Consent: The  procedure was performed in an emergent situation. Patient identity confirmed: verbally with patient Time out: Immediately prior to procedure a "time out" was called to verify the correct patient, procedure, equipment, support staff and site/side marked as required. Indications: respiratory failure Intubation method: video-assisted Patient status: unconscious Preoxygenation: BVM Laryngoscope size: Mac 3 Tube size: 7.5 mm Tube type: cuffed Number of attempts: 1 Cords  visualized: yes Post-procedure assessment: chest rise and CO2 detector Breath sounds: equal Cuff inflated: yes ETT to lip: 23 cm Tube secured with: ETT holder Chest x-ray interpreted by me. Chest x-ray findings: endotracheal tube too low Tube repositioned: tube repositioned successfully Patient tolerance: Patient tolerated the procedure well with no immediate complications  .Critical Care Performed by: Stacy Gardner Authorized by: Stacy Gardner Total critical care time: 40 minutes Critical care time was exclusive of separately billable procedures and treating other patients and teaching time. Critical care was necessary to treat or prevent imminent or life-threatening deterioration of the following conditions: cardiac failure and respiratory failure. Critical care was time spent personally by me on the following activities: blood draw for specimens, development of treatment plan with patient or surrogate, discussions with consultants, evaluation of patient's response to treatment, examination of patient, obtaining history from patient or surrogate, ordering and performing treatments and interventions, ordering and review of laboratory studies, ordering and review of radiographic studies, pulse oximetry, re-evaluation of patient's condition, review of old charts and ventilator management.   (including critical care time) Labs Review Labs Reviewed  CBC WITH DIFFERENTIAL/PLATELET - Abnormal; Notable for the following:    WBC 19.1 (*)    RBC 3.69 (*)    Hemoglobin 11.5 (*)    HCT 34.7 (*)    Platelets 137 (*)    Neutro Abs 8.4 (*)    Lymphs Abs 10.1 (*)    Basophils Absolute 0.2 (*)    All other components within normal limits  COMPREHENSIVE METABOLIC PANEL - Abnormal; Notable for the following:    CO2 16 (*)    Glucose, Bld 393 (*)    BUN 24 (*)    Creatinine, Ser 3.05 (*)    Calcium 7.8 (*)    Total Protein 4.9 (*)    Albumin 2.4 (*)    AST 91 (*)    GFR calc non Af Amer 15  (*)    GFR calc Af Amer 17 (*)    Anion gap 16 (*)    All other components within normal limits  PROTIME-INR - Abnormal; Notable for the following:    Prothrombin Time 18.5 (*)    INR 1.54 (*)    All other components within normal limits  URINALYSIS, ROUTINE W REFLEX MICROSCOPIC (NOT AT [facility]) - Abnormal; Notable for the following:    Color, Urine AMBER (*)    APPearance TURBID (*)    Glucose, UA 250 (*)    Hgb urine dipstick LARGE (*)    Bilirubin Urine SMALL (*)    Ketones, ur 15 (*)    Protein, ur 100 (*)    Leukocytes, UA LARGE (*)    All other components within normal limits  PROTIME-INR - Abnormal; Notable for the following:    Prothrombin Time 19.7 (*)    INR 1.67 (*)    All other components within normal limits  APTT - Abnormal; Notable for the following:    aPTT 39 (*)    All other components within normal limits  URINE MICROSCOPIC-ADD ON - Abnormal; Notable for the following:    Squamous  Epithelial / LPF 6-30 (*)    Bacteria, UA MANY (*)    Casts HYALINE CASTS (*)    All other components within normal limits  I-STAT TROPOININ, ED - Abnormal; Notable for the following:    Troponin i, poc 0.41 (*)    All other components within normal limits  I-STAT CHEM 8, ED - Abnormal; Notable for the following:    BUN 27 (*)    Creatinine, Ser 2.40 (*)    Glucose, Bld 363 (*)    Calcium, Ion 1.06 (*)    Hemoglobin 11.9 (*)    HCT 35.0 (*)    All other components within normal limits  I-STAT CG4 LACTIC ACID, ED - Abnormal; Notable for the following:    Lactic Acid, Venous 9.34 (*)    All other components within normal limits  POCT I-STAT 3, ART BLOOD GAS (G3+) - Abnormal; Notable for the following:    pH, Arterial 7.176 (*)    pCO2 arterial 46.1 (*)    Bicarbonate 18.0 (*)    Acid-base deficit 11.0 (*)    All other components within normal limits  BLOOD GAS, ARTERIAL  TROPONIN I  TROPONIN I  TROPONIN I  BASIC METABOLIC PANEL  BLOOD GAS, ARTERIAL  CBC  BASIC METABOLIC  PANEL  MAGNESIUM  PHOSPHORUS  COMPREHENSIVE METABOLIC PANEL  MAGNESIUM  PHOSPHORUS  LACTIC ACID, PLASMA  LACTIC ACID, PLASMA  TROPONIN I    Imaging Review Ct Head Wo Contrast  09/25/2015  CLINICAL DATA:  Initial a a aeration status post cardiac arrest, unresponsive. EXAM: CT HEAD WITHOUT CONTRAST TECHNIQUE: Contiguous axial images were obtained from the base of the skull through the vertex without intravenous contrast. COMPARISON:  None. FINDINGS: There is suggestion of early loss of cortical sulcation throughout bilateral cerebral hemispheres, suggesting cerebral edema. Additionally, question early blurring of gray-white matter differentiation. Focal hypodensity within the bilateral basal ganglia, most prominent within the caudate heads and lentiform nuclei. Constellation of findings worrisome for generalized cerebral anoxia. There is effacement of the basilar cisterns. Possible early transtentorial herniation seen at the level of foramen magnum. No uncal herniation. No hydrocephalus. No midline shift. No acute large vessel territory infarct. No intracranial hemorrhage identified. No extra-axial fluid collection. Scalp soft tissues demonstrate no acute abnormality. No acute abnormality about the orbits. Scattered mucosal thickening within the ethmoidal air cells. Paranasal sinuses are otherwise largely clear. Trace opacity within the inferior right mastoid air cells. Calvarium intact. IMPRESSION: Early diffuse loss of cortical sulcation with blurring of the gray-white matter differentiation and focal hypodensity within the bilateral basal ganglia as above. Constellation of findings is highly concerning for generalized cerebral anoxia given the history of cardiac arrest. There is effacement of the basilar cisterns with suggestion of possible early/developing transtentorial herniation. Electronically Signed   By: Rise Mu M.D.   On: 09/22/2015 23:34   Dg Chest Portable 1 View  09/13/2015   CLINICAL DATA:  Central line placement.  Initial encounter. EXAM: PORTABLE CHEST 1 VIEW COMPARISON:  Chest radiograph performed earlier today at 8:18 p.m. FINDINGS: The patient's left IJ line is noted ending about the mid SVC. The patient's endotracheal tube is seen ending 3-4 cm above the carina. An enteric tube is noted extending below the diaphragm, though the side port is noted at the distal esophagus. Patchy bilateral central and bibasilar airspace opacities may reflect mild interstitial edema or multifocal pneumonia. No pleural effusion or pneumothorax is seen. The cardiomediastinal silhouette is borderline normal in size. The patient  is status post median sternotomy, with evidence of prior CABG. External pacing pads are noted. No acute osseous abnormalities are seen. IMPRESSION: 1. Left IJ line noted ending about the mid SVC. 2. Endotracheal tube seen ending 3-4 cm above the carina. 3. Enteric tube noted extending below the diaphragm, with the side port noted at the distal esophagus. 4. Patchy bilateral central and bibasilar airspace opacities may reflect mild interstitial edema or multifocal pneumonia. Electronically Signed   By: Roanna RaiderJeffery  Chang M.D.   On: 09-01-15 22:51   Dg Chest Port 1 View  09-01-15  CLINICAL DATA:  Cardiac arrest, CPR. EXAM: PORTABLE CHEST 1 VIEW COMPARISON:  None. FINDINGS: Cardiomegaly and CABG changes identified. Pulmonary vascular congestion is noted. An endotracheal tube with tip 2.5 cm above the carina is present. An NG tube entering the stomach is present. There is no evidence of pneumothorax or definite pleural effusion. IMPRESSION: Cardiomegaly with pulmonary vascular congestion. Support apparatus as described. Electronically Signed   By: Harmon PierJeffrey  Hu M.D.   On: 09-01-15 20:35   I have personally reviewed and evaluated these images and lab results as part of my medical decision-making.   EKG Interpretation None      MDM   Final diagnoses:  Cardiac arrest  [facility](HCC)    69 year old Caucasian female with past medical history of hypertension diabetes presents in setting of cardiac arrest. Per EMS and family report patient had multiple bouts of presyncope today. Reportedly patient had a bout of syncope and at that time did not get up per family report. This was a witnessed event. Patient noted to not have pulses and EMS was called. Firefighters) 45 minutes and CPR was initiated. At time of EMS arrival CPR was continuing and patient noted to be in PE a arrest. Patient had IV access obtained and continued CPR. Patient received multiple rounds of epinephrine before return of spontaneous circulation approximately 30 minutes after collapse. Patient started on epinephrine drip in transport to emergency department.  On arrival patient noted to have weak thready pulses. Patient had bilateral nonreactive pupils. Patient had King airway in place from EMS with bilateral breath sounds. Pulses and patient patient lost pulses and CPR was initiated. Patient received approximately 45 minutes of CPR with 1 mg of epinephrine. Return of spontaneous circulation occurred. At that time patient was intubated with 7.5 ET tube as noted in the dictation above. Patient started on epinephrine drip at that time with maintenance of spontaneous circulation. Patient continued to have no obvious neurologic function. Patient was cooled at that time due to out-of-hospital cardiac arrest.  Critical care team was contacted and patient admitted to ICU in stable condition.  Attending has seen and evaluated patient and Dr. Ranae PalmsYelverton is in agreement with plan.  Stacy GardnerAndrew Jesscia Imm, MD 10/01/2015 [id number]  Loren Raceravid Yelverton, MD 10/03/15 2059

## 2015-09-26 NOTE — Progress Notes (Signed)
ANTIBIOTIC CONSULT NOTE - INITIAL  Pharmacy Consult for Zosyn and vancomycin Indication: Sepsis / Aspiration PNA  No Known Allergies  Patient Measurements: Height: 5\' 6"  (167.6 cm) Weight: 176 lb 5.9 oz (80 kg) IBW/kg (Calculated) : 59.3  Vital Signs: Temp: 95.7 F (35.4 C) (12/31 2148) BP: 133/68 mmHg (12/31 2148) Pulse Rate: 77 (12/31 2148) Intake/Output from previous day:   Intake/Output from this shift:    Labs:  Recent Labs  09/12/2015 2009 09/04/2015 2011  WBC 19.1*  --   HGB 11.5* 11.9*  PLT 137*  --   CREATININE 3.05* 2.40*   Estimated Creatinine Clearance: 24.3 mL/min (by C-G formula based on Cr of 2.4). No results for input(s): VANCOTROUGH, VANCOPEAK, VANCORANDOM, GENTTROUGH, GENTPEAK, GENTRANDOM, TOBRATROUGH, TOBRAPEAK, TOBRARND, AMIKACINPEAK, AMIKACINTROU, AMIKACIN in the last 72 hours.   Microbiology: No results found for this or any previous visit (from the past 720 hour(s)).  Medical History: Past Medical History  Diagnosis Date  . Diabetes mellitus without complication (HCC)   . High cholesterol     Assessment: 69 yo F presents on 12/31 after a syncopal episode at home. When EMS arrived she was in PEA and CPR started. Pharmacy consulted to dose abx for sepsis / aspiration PNA. SCr 2.4, CrCl ~5125ml/min. Hypothermic due to cooling with saline and WBC elevated at 19.1.  Goal of Therapy:  Vancomycin trough level 15-20 mcg/ml  Resolution of infection  Plan:  Give Zosyn 3.375g IV (30 min infusion) x 1, then start Zosyn 3.375 gm IV q8h (4 hour infusion) Give vancomycin 1.5g IV x 1, then start 750mg  IV Q24 tomorrow Monitor clinical picture, renal function closely, VT at Css F/U C&S, abx deescalation / LOT  Enzo BiNathan Delonda Coley, PharmD, BCPS Clinical Pharmacist Pager [phone] 09/02/2015 9:55 PM

## 2015-09-26 NOTE — H&P (Signed)
PULMONARY / CRITICAL CARE MEDICINE   Name: [name] MRN: [id number] DOB: 1945/10/27    ADMISSION DATE:  09/14/2015  CHIEF COMPLAINT:  PEA arrest  HISTORY OF PRESENT ILLNESS:   69yo CF with h/o DM (insulin dependent), CAD s/p CABG (early 2000's) was in her usual state of health until today when she suffered several mechanical falls at home.  Her husband states yesterday she ate some vegetables at a restaurant.  She was nauseated today with several episodes of vomiting.  She was sitting on the toilet and vomited, called for her husband, he arrived in the bathroom and she lost consciousness.  He called 911 and EMS arrrived 4-91min.  PEA was rhythm. CPR x30 min.  Taken to ED, pulse lost again for <16min.  Epi gtt started.  Husband states she took none of her medication today (including insulin) and did not eat.  Cooling began.   PAST MEDICAL HISTORY :   has a past medical history of Diabetes mellitus without complication (HCC) and High cholesterol.  has no past surgical history on file. Prior to Admission medications   Medication Sig Start Date End Date Taking? Authorizing Provider  carvedilol (COREG) 12.5 MG tablet Take 12.5 mg by mouth 2 (two) times daily with a meal.   Yes Historical Provider, MD  Colesevelam HCl (WELCHOL) 3.75 g PACK Take by mouth.   Yes Historical Provider, MD  fluticasone (FLONASE) 50 MCG/ACT nasal spray Place 1 spray into both nostrils daily.   Yes Historical Provider, MD  gabapentin (NEURONTIN) 600 MG tablet Take 600 mg by mouth 3 (three) times daily.   Yes Historical Provider, MD  insulin detemir (LEVEMIR) 100 UNIT/ML injection Inject into the skin at bedtime.   Yes Historical Provider, MD  insulin lispro (HUMALOG) 100 UNIT/ML injection Inject into the skin 3 (three) times daily before meals.   Yes Historical Provider, MD  ramipril (ALTACE) 5 MG capsule Take 5 mg by mouth daily.   Yes Historical Provider, MD  simvastatin (ZOCOR) 40 MG tablet Take 40 mg by mouth  every evening.   Yes Historical Provider, MD  sitaGLIPtin (JANUVIA) 100 MG tablet Take 100 mg by mouth daily.   Yes Historical Provider, MD   No Known Allergies  FAMILY HISTORY:  has no family status information on file.  SOCIAL HISTORY:    REVIEW OF SYSTEMS:   + Nausea, vomiting, LOC - for fevers, chills, diarrhea, wt changes, AMS, heat/cold intolerance, rashes, chest pain, SOB, DOE.  SUBJECTIVE:   VITAL SIGNS: Temp:  [96.1 F (35.6 C)-96.6 F (35.9 C)] 96.6 F (35.9 C) (12/31 2122) Pulse Rate:  [52-104] 80 (12/31 2122) Resp:  [11-66] 17 (12/31 2122) BP: (64-210)/(28-110) 95/68 mmHg (12/31 2122) SpO2:  [100 %] 100 % (12/31 2122) FiO2 (%):  [100 %] 100 % (12/31 2020) Weight:  [80 kg (176 lb 5.9 oz)] 80 kg (176 lb 5.9 oz) (12/31 2005) HEMODYNAMICS:   VENTILATOR SETTINGS: Vent Mode:  [-] PRVC FiO2 (%):  [100 %] 100 % Set Rate:  [14 bmp] 14 bmp Vt Set:  [500 mL] 500 mL PEEP:  [5 cmH20] 5 cmH20 Plateau Pressure:  [24 cmH20] 24 cmH20 INTAKE / OUTPUT: No intake or output data in the 24 hours ending 09/10/2015 2138  PHYSICAL EXAMINATION: General:  Obtunded Neuro:  GCS3, no withdrawal to pain. No gag, cough or corneal reflex. HEENT:  NCAT, ETT in place Cardiovascular:  RRR, no m/r/g Lungs:  Course b/l with rhonchi diffuse Abdomen:  Soft, non tender, normal bowel  sounds Musculoskeletal:  Normal bulk Skin:  Left hand knuckle skin lac.  LABS:  CBC  Recent Labs Lab 2015-10-16 2009 10-16-15 2011  WBC 19.1*  --   HGB 11.5* 11.9*  HCT 34.7* 35.0*  PLT 137*  --    Coag's  Recent Labs Lab 10-16-2015 2009  INR 1.54*   BMET  Recent Labs Lab 2015/10/16 2009 10/16/15 2011  NA 136 138  K 4.9 4.7  CL 104 105  CO2 16*  --   BUN 24* 27*  CREATININE 3.05* 2.40*  GLUCOSE 393* 363*   Electrolytes  Recent Labs Lab October 16, 2015 2009  CALCIUM 7.8*   Sepsis Markers  Recent Labs Lab 2015-10-16 2011  LATICACIDVEN 9.34*   ABG No results for input(s): PHART, PCO2ART,  PO2ART in the last 168 hours. Liver Enzymes  Recent Labs Lab 10/16/2015 2009  AST 91*  ALT 36  ALKPHOS 58  BILITOT 1.1  ALBUMIN 2.4*   Cardiac Enzymes No results for input(s): TROPONINI, PROBNP in the last 168 hours. Glucose No results for input(s): GLUCAP in the last 168 hours.  Imaging Dg Chest Port 1 View  Oct 16, 2015  CLINICAL DATA:  Cardiac arrest, CPR. EXAM: PORTABLE CHEST 1 VIEW COMPARISON:  None. FINDINGS: Cardiomegaly and CABG changes identified. Pulmonary vascular congestion is noted. An endotracheal tube with tip 2.5 cm above the carina is present. An NG tube entering the stomach is present. There is no evidence of pneumothorax or definite pleural effusion. IMPRESSION: Cardiomegaly with pulmonary vascular congestion. Support apparatus as described. Electronically Signed   By: Harmon Pier M.D.   On: 2015-10-16 20:35     ASSESSMENT / PLAN:  69 yo CF with PEA arrest x3 in 6 hours and diffuse anoxic brain injury with transtentorial herniation.  She was intubated in the ED and a CVC was placed for epinephrine gtt, the cooling protocol was initiated however after she was known to be PEA this was changed to the normothermia protocol.  CT head in the ED demonstrated diffuse anoxic injury with early herniation.  Upon arrival to the ICU she suffered another PEA arrest after she became hypotensive.  Epinephrine, levophed and vasopressin was started.  She was given 3 amps of sodium bicarb, 1gm calcium chloride during the code.  ROSC was achieved (see code blue note for full details) and ABG demonstrated metabolic and respiratory acidosis, the MV was maximized.  CXR did not demontrate e/o pneumothorax, no obvious source of infection was found, no obvious/reversable  electrolyte abnormalities were found other than hyperK which was treated with insulin and D50.  I spoke with the family and discussed the grave prognosis and the CT findings.  The stated that they would like to pursue comfort  measures with a compassionate extubation.  Comfort measures and end of life care initiated.  FAMILY  - Updates: at the bedside, updated by me  Total critical care time: 60 min  Critical care time was exclusive of separately billable procedures and treating other patients.  Critical care was necessary to treat or prevent imminent or life-threatening deterioration.  Critical care was time spent personally by me on the following activities: development of treatment plan with patient and/or surrogate as well as nursing, discussions with consultants, evaluation of patient's response to treatment, examination of patient, obtaining history from patient or surrogate, ordering and performing treatments and interventions, ordering and review of laboratory studies, ordering and review of radiographic studies, pulse oximetry and re-evaluation of patient's condition.   Galvin Proffer, DO., MS [place]  Pulmonary and Critical Care Medicine     Pulmonary and Critical Care Medicine [facility] Pager: [phone]  07/23/2015, 9:38 PM

## 2015-09-27 LAB — POCT I-STAT, CHEM 8
BUN: 27 mg/dL — ABNORMAL HIGH (ref 6–20)
CHLORIDE: 104 mmol/L (ref 101–111)
CREATININE: 2.1 mg/dL — AB (ref 0.44–1.00)
Calcium, Ion: 0.94 mmol/L — ABNORMAL LOW (ref 1.13–1.30)
GLUCOSE: 489 mg/dL — AB (ref 65–99)
HCT: 32 % — ABNORMAL LOW (ref 36.0–46.0)
Hemoglobin: 10.9 g/dL — ABNORMAL LOW (ref 12.0–15.0)
POTASSIUM: 5.9 mmol/L — AB (ref 3.5–5.1)
Sodium: 141 mmol/L (ref 135–145)
TCO2: 23 mmol/L (ref 0–100)

## 2015-09-27 LAB — COMPREHENSIVE METABOLIC PANEL
ALT: 49 U/L (ref 14–54)
AST: 159 U/L — AB (ref 15–41)
Albumin: 1.9 g/dL — ABNORMAL LOW (ref 3.5–5.0)
Alkaline Phosphatase: 59 U/L (ref 38–126)
Anion gap: 12 (ref 5–15)
BILIRUBIN TOTAL: 1.3 mg/dL — AB (ref 0.3–1.2)
BUN: 25 mg/dL — AB (ref 6–20)
CHLORIDE: 107 mmol/L (ref 101–111)
CO2: 20 mmol/L — ABNORMAL LOW (ref 22–32)
CREATININE: 2.35 mg/dL — AB (ref 0.44–1.00)
Calcium: 6.6 mg/dL — ABNORMAL LOW (ref 8.9–10.3)
GFR calc Af Amer: 24 mL/min — ABNORMAL LOW (ref >60)
GFR, EST NON AFRICAN AMERICAN: 20 mL/min — AB (ref >60)
Glucose, Bld: 498 mg/dL — ABNORMAL HIGH (ref 65–99)
Potassium: 4.5 mmol/L (ref 3.5–5.1)
Sodium: 139 mmol/L (ref 135–145)
Total Protein: 3.9 g/dL — ABNORMAL LOW (ref 6.5–8.1)

## 2015-09-27 LAB — APTT: APTT: 39 s — AB (ref 24–37)

## 2015-09-27 LAB — TROPONIN I: TROPONIN I: 0.45 ng/mL — AB (ref <0.031)

## 2015-09-27 LAB — PHOSPHORUS: Phosphorus: 7.6 mg/dL — ABNORMAL HIGH (ref 2.5–4.6)

## 2015-09-27 LAB — MAGNESIUM: MAGNESIUM: 2.1 mg/dL (ref 1.7–2.4)

## 2015-09-27 LAB — PROTIME-INR
INR: 1.67 — AB (ref 0.00–1.49)
PROTHROMBIN TIME: 19.7 s — AB (ref 11.6–15.2)

## 2015-09-27 LAB — LACTIC ACID, PLASMA: Lactic Acid, Venous: 6.5 mmol/L (ref 0.5–2.0)

## 2015-09-27 MED ORDER — MORPHINE SULFATE (PF) 10 MG/ML IV SOLN
10.0000 mg | Freq: Once | INTRAVENOUS | Status: DC
Start: 2015-09-27 — End: 2015-09-27

## 2015-09-27 MED ORDER — MORPHINE SULFATE (PF) 4 MG/ML IV SOLN: 8.0000 mg | INTRAVENOUS | Status: DC | PRN

## 2015-09-27 MED ORDER — MORPHINE SULFATE (PF) 4 MG/ML IV SOLN
INTRAVENOUS | Status: AC
  Filled 2015-09-27: qty 3

## 2015-09-27 MED ORDER — MIDAZOLAM HCL 2 MG/2ML IJ SOLN
4.0000 mg | INTRAMUSCULAR | Status: DC | PRN
Start: 2015-09-27 — End: 2015-09-27

## 2015-09-27 MED FILL — Medication: Qty: 1 | Status: AC

## 2015-09-27 NOTE — Progress Notes (Signed)
Fentanyl drip 230 ml wasted in sink by Rocco Paulsara Jamiyah Dingley RN & [place] Perkins RN

## 2015-09-27 NOTE — Progress Notes (Signed)
0043 Pt asystole on monitor with no BP on arterial line. Rocco Paulsara Shelaine Frie and Thayer Ohmhris Bungque ausculated no heart or breath sounds. Family at bedside.

## 2015-09-27 NOTE — Progress Notes (Signed)
Patient was extubated per MD order. Family, MD and RN at bedside.

## 2015-09-27 DEATH — deceased

## 2015-09-28 ENCOUNTER — Encounter: Payer: Self-pay | Admitting: Emergency Medicine

## 2015-09-29 ENCOUNTER — Telehealth: Payer: Self-pay

## 2015-09-29 NOTE — Telephone Encounter (Signed)
On 09/29/2015 I received a death certificate from [employer] [facility] The death certificate is for burial. The patient is a patient of Cleone SlimDaniel [place]. The death certificate will be signed by Doctor Craige CottaSood. The death certificate will be taken to Pulmonary unit for signature Thursday since he will not be in the office until Thursday. On 10/02/2015 I received the death certificate back from Doctor KotlikSood. I got the death certificate ready and called the funeral home to let them know the death certificate is ready for pickup.

## 2015-09-30 NOTE — Discharge Summary (Signed)
Katie ScarletLinda K Martin was 70 y.o. female admitted on 09/12/2015 with vomiting, syncope.  EMS called and she was in PEA cardiac arrest.  She had 30 minutes before ROSC.  She was intubated and started on epinephrine gtt.  She had CT head which showed findings consistent with anoxic encephalopathy.  Family did not want to pursue aggressive measures in this situation.  She was made DNR and transitioned to comfort measures only.  She was extubated and then expired on 2015/12/29 at 0043.  Final Diagnoses: PEA cardiac arrest Cardiogenic shock Coronary artery disease s/p CABG Chronic systolic CHF with EF 45% from Echo 02/17/15 Anoxic encephalopathy Acute hypoxic respiratory failure Acute kidney injury Metabolic acidosis Lactic acidosis Hyperkalemia Anemia of critical illness Coagulopathy Hx of DM type II Hx of Hyperlipidemia Hx of Hypertension  Katie HellingVineet Donnica Jarnagin, MD [facility] 09/30/2015, 9:49 AM

## 2015-10-28 DEATH — deceased
